# Patient Record
Sex: Female | Born: 2000 | Race: White | Hispanic: No | Marital: Married | Smoking: Never smoker
Health system: Southern US, Community
[De-identification: ages and names within clinical notes are randomized; demographics above are authoritative.]

## PROBLEM LIST (undated history)

## (undated) ENCOUNTER — Inpatient Hospital Stay (HOSPITAL_COMMUNITY): Payer: Self-pay

## (undated) DIAGNOSIS — F419 Anxiety disorder, unspecified: Secondary | ICD-10-CM

## (undated) DIAGNOSIS — G43909 Migraine, unspecified, not intractable, without status migrainosus: Secondary | ICD-10-CM

## (undated) DIAGNOSIS — N926 Irregular menstruation, unspecified: Secondary | ICD-10-CM

## (undated) DIAGNOSIS — Z309 Encounter for contraceptive management, unspecified: Secondary | ICD-10-CM

## (undated) DIAGNOSIS — T1490XA Injury, unspecified, initial encounter: Secondary | ICD-10-CM

## (undated) HISTORY — DX: Anxiety disorder, unspecified: F41.9

## (undated) HISTORY — DX: Irregular menstruation, unspecified: N92.6

## (undated) HISTORY — DX: Injury, unspecified, initial encounter: T14.90XA

## (undated) HISTORY — PX: OTHER SURGICAL HISTORY: SHX169

## (undated) HISTORY — DX: Encounter for contraceptive management, unspecified: Z30.9

## (undated) HISTORY — PX: WISDOM TOOTH EXTRACTION: SHX21

---
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
## 2018-01-19 ENCOUNTER — Encounter: Payer: Self-pay | Admitting: Advanced Practice Midwife

## 2018-01-19 ENCOUNTER — Ambulatory Visit (INDEPENDENT_AMBULATORY_CARE_PROVIDER_SITE_OTHER): Payer: Medicaid Other | Admitting: Advanced Practice Midwife

## 2018-01-19 VITALS — BP 120/80 | HR 109 | Wt 196.0 lb

## 2018-01-19 DIAGNOSIS — Z331 Pregnant state, incidental: Secondary | ICD-10-CM

## 2018-01-19 DIAGNOSIS — Z1389 Encounter for screening for other disorder: Secondary | ICD-10-CM

## 2018-01-19 DIAGNOSIS — Z3A31 31 weeks gestation of pregnancy: Secondary | ICD-10-CM

## 2018-01-19 DIAGNOSIS — Z3403 Encounter for supervision of normal first pregnancy, third trimester: Secondary | ICD-10-CM

## 2018-01-19 LAB — POCT URINALYSIS DIPSTICK
Blood, UA: NEGATIVE
GLUCOSE UA: NEGATIVE
KETONES UA: NEGATIVE
Leukocytes, UA: NEGATIVE
Nitrite, UA: NEGATIVE
Protein, UA: NEGATIVE

## 2018-01-19 NOTE — Progress Notes (Signed)
  [id number] Estimated Date of Delivery: 03/18/18  Blood pressure 120/80, pulse (!) 109, weight 196 lb (88.9 kg), last menstrual period 06/11/2017.   BP weight and urine results all reviewed and noted.  Please refer to the obstetrical flow sheet for the fundal height and fetal heart rate documentation:  Patient reports good fetal movement, denies any bleeding and no rupture of membranes symptoms or regular contractions. Patient is without complaints. All questions were answered.   Physical Assessment:   Vitals:   01/19/18 1531 01/19/18 1553 01/19/18 1558  BP: 120/90 112/82 120/80  Pulse: (!) 109    Weight: 196 lb (88.9 kg)    Body mass index is 31.64 kg/m.  BP rechecks normal        Physical Examination:   General appearance: Well appearing, and in no distress  Mental status: Alert, oriented to person, place, and time  Skin: Warm & dry  Cardiovascular: Normal heart rate noted  Respiratory: Normal respiratory effort, no distress  Abdomen: Soft, gravid, nontender  Pelvic: Cervical exam deferred         Extremities: Edema: Trace  Fetal Status: Fetal Heart Rate (bpm): 152 Fundal Height: 31 cm Movement: Present    Results for orders placed or performed in visit on 01/19/18 (from the past 24 hour(s))  POCT urinalysis dipstick   Collection Time: 01/19/18  3:35 PM  Result Value Ref Range   Color, UA     Clarity, UA     Glucose, UA neg    Bilirubin, UA     Ketones, UA neg    Spec Grav, UA  1.010 - 1.025   Blood, UA neg    pH, UA  5.0 - 8.0   Protein, UA neg    Urobilinogen, UA  0.2 or 1.0 E.U./dL   Nitrite, UA neg    Leukocytes, UA Negative Negative   Appearance     Odor       Orders Placed This Encounter  Procedures  . POCT urinalysis dipstick    Plan:  Continued routine obstetrical care,  Return in about 2 weeks (around 02/02/2018) for LROB.

## 2018-01-19 NOTE — Patient Instructions (Signed)
Madesyn N Thomann, I greatly value your feedback.  If you receive a survey following your visit with us today, we appreciate you taking the time to fill it out.  Thanks, Cathie BeamsFran Cresenzo-Dishmon, CNM   Call the office [phone]) or go to [facility] if:  You begin to have strong, frequent contractions  Your water breaks.  Sometimes it is a big gush of fluid, sometimes it is just a trickle that keeps getting your panties wet or running down your legs  You have vaginal bleeding.  It is normal to have a small amount of spotting if your cervix was checked.   You don't feel your baby moving like normal.  If you don't, get you something to eat and drink and lay down and focus on feeling your baby move.  You should feel at least 10 movements in 2 hours.  If you don't, you should call the office or go to [facility].    Tdap Vaccine  It is recommended that you get the Tdap vaccine during the third trimester of EACH pregnancy to help protect your baby from getting pertussis (whooping cough)  27-36 weeks is the BEST time to do this so that you can pass the protection on to your baby. During pregnancy is better than after pregnancy, but if you are unable to get it during pregnancy it will be offered at the hospital.   You can get this vaccine at the health department or your family doctor  Everyone who will be around your baby should also be up-to-date on their vaccines. Adults (who are not pregnant) only need 1 dose of Tdap during adulthood.   Third Trimester of Pregnancy The third trimester is from week 29 through week 42, months 7 through 9. The third trimester is a time when the fetus is growing rapidly. At the end of the ninth month, the fetus is about 20 inches in length and weighs 6-10 pounds.  BODY CHANGES Your body goes through many changes during pregnancy. The changes vary from woman to woman.   Your weight will continue to increase. You can expect to gain 25-35 pounds (11-16 kg)  by the end of the pregnancy.  You may begin to get stretch marks on your hips, abdomen, and breasts.  You may urinate more often because the fetus is moving lower into your pelvis and pressing on your bladder.  You may develop or continue to have heartburn as a result of your pregnancy.  You may develop constipation because certain hormones are causing the muscles that push waste through your intestines to slow down.  You may develop hemorrhoids or swollen, bulging veins (varicose veins).  You may have pelvic pain because of the weight gain and pregnancy hormones relaxing your joints between the bones in your pelvis. Backaches may result from overexertion of the muscles supporting your posture.  You may have changes in your hair. These can include thickening of your hair, rapid growth, and changes in texture. Some women also have hair loss during or after pregnancy, or hair that feels dry or thin. Your hair will most likely return to normal after your baby is born.  Your breasts will continue to grow and be tender. A yellow discharge may leak from your breasts called colostrum.  Your belly button may stick out.  You may feel short of breath because of your expanding uterus.  You may notice the fetus "dropping," or moving lower in your abdomen.  You may have a bloody mucus  discharge. This usually occurs a few days to a week before labor begins.  Your cervix becomes thin and soft (effaced) near your due date. WHAT TO EXPECT AT YOUR PRENATAL EXAMS  You will have prenatal exams every 2 weeks until week 36. Then, you will have weekly prenatal exams. During a routine prenatal visit:  You will be weighed to make sure you and the fetus are growing normally.  Your blood pressure is taken.  Your abdomen will be measured to track your baby's growth.  The fetal heartbeat will be listened to.  Any test results from the previous visit will be discussed.  You may have a cervical check near  your due date to see if you have effaced. At around 36 weeks, your caregiver will check your cervix. At the same time, your caregiver will also perform a test on the secretions of the vaginal tissue. This test is to determine if a type of bacteria, Group B streptococcus, is present. Your caregiver will explain this further. Your caregiver may ask you:  What your birth plan is.  How you are feeling.  If you are feeling the baby move.  If you have had any abnormal symptoms, such as leaking fluid, bleeding, severe headaches, or abdominal cramping.  If you have any questions. Other tests or screenings that may be performed during your third trimester include:  Blood tests that check for low iron levels (anemia).  Fetal testing to check the health, activity level, and growth of the fetus. Testing is done if you have certain medical conditions or if there are problems during the pregnancy. FALSE LABOR You may feel small, irregular contractions that eventually go away. These are called Braxton Hicks contractions, or false labor. Contractions may last for hours, days, or even weeks before true labor sets in. If contractions come at regular intervals, intensify, or become painful, it is best to be seen by your caregiver.  SIGNS OF LABOR   Menstrual-like cramps.  Contractions that are 5 minutes apart or less.  Contractions that start on the top of the uterus and spread down to the lower abdomen and back.  A sense of increased pelvic pressure or back pain.  A watery or bloody mucus discharge that comes from the vagina. If you have any of these signs before the 37th week of pregnancy, call your caregiver right away. You need to go to the hospital to get checked immediately. HOME CARE INSTRUCTIONS   Avoid all smoking, herbs, alcohol, and unprescribed drugs. These chemicals affect the formation and growth of the baby.  Follow your caregiver's instructions regarding medicine use. There are  medicines that are either safe or unsafe to take during pregnancy.  Exercise only as directed by your caregiver. Experiencing uterine cramps is a good sign to stop exercising.  Continue to eat regular, healthy meals.  Wear a good support bra for breast tenderness.  Do not use hot tubs, steam rooms, or saunas.  Wear your seat belt at all times when driving.  Avoid raw meat, uncooked cheese, cat litter boxes, and soil used by cats. These carry germs that can cause birth defects in the baby.  Take your prenatal vitamins.  Try taking a stool softener (if your caregiver approves) if you develop constipation. Eat more high-fiber foods, such as fresh vegetables or fruit and whole grains. Drink plenty of fluids to keep your urine clear or pale yellow.  Take warm sitz baths to soothe any pain or discomfort caused by hemorrhoids. Use  hemorrhoid cream if your caregiver approves.  If you develop varicose veins, wear support hose. Elevate your feet for 15 minutes, 3-4 times a day. Limit salt in your diet.  Avoid heavy lifting, wear low heal shoes, and practice good posture.  Rest a lot with your legs elevated if you have leg cramps or low back pain.  Visit your dentist if you have not gone during your pregnancy. Use a soft toothbrush to brush your teeth and be gentle when you floss.  A sexual relationship may be continued unless your caregiver directs you otherwise.  Do not travel far distances unless it is absolutely necessary and only with the approval of your caregiver.  Take prenatal classes to understand, practice, and ask questions about the labor and delivery.  Make a trial run to the hospital.  Pack your hospital bag.  Prepare the baby's nursery.  Continue to go to all your prenatal visits as directed by your caregiver. SEEK MEDICAL CARE IF:  You are unsure if you are in labor or if your water has broken.  You have dizziness.  You have mild pelvic cramps, pelvic pressure, or  nagging pain in your abdominal area.  You have persistent nausea, vomiting, or diarrhea.  You have a bad smelling vaginal discharge.  You have pain with urination. SEEK IMMEDIATE MEDICAL CARE IF:   You have a fever.  You are leaking fluid from your vagina.  You have spotting or bleeding from your vagina.  You have severe abdominal cramping or pain.  You have rapid weight loss or gain.  You have shortness of breath with chest pain.  You notice sudden or extreme swelling of your face, hands, ankles, feet, or legs.  You have not felt your baby move in over an hour.  You have severe headaches that do not go away with medicine.  You have vision changes. Document Released: 09/15/2001 Document Revised: 09/26/2013 Document Reviewed: 11/22/2012 [facility] Patient Information 2015 [place], [place]. This information is not intended to replace advice given to you by your health care provider. Make sure you discuss any questions you have with your health care provider.

## 2018-02-02 ENCOUNTER — Encounter: Payer: Self-pay | Admitting: Advanced Practice Midwife

## 2018-02-02 ENCOUNTER — Ambulatory Visit (INDEPENDENT_AMBULATORY_CARE_PROVIDER_SITE_OTHER): Payer: Medicaid Other | Admitting: Advanced Practice Midwife

## 2018-02-02 VITALS — BP 120/80 | HR 98 | Wt 203.0 lb

## 2018-02-02 DIAGNOSIS — Z1389 Encounter for screening for other disorder: Secondary | ICD-10-CM

## 2018-02-02 DIAGNOSIS — Z3403 Encounter for supervision of normal first pregnancy, third trimester: Secondary | ICD-10-CM

## 2018-02-02 DIAGNOSIS — Z331 Pregnant state, incidental: Secondary | ICD-10-CM

## 2018-02-02 DIAGNOSIS — Z3A33 33 weeks gestation of pregnancy: Secondary | ICD-10-CM

## 2018-02-02 LAB — POCT URINALYSIS DIPSTICK
Blood, UA: NEGATIVE
Glucose, UA: NEGATIVE
KETONES UA: NEGATIVE
LEUKOCYTES UA: NEGATIVE
NITRITE UA: NEGATIVE
Protein, UA: NEGATIVE

## 2018-02-02 NOTE — Progress Notes (Signed)
  G1P0000 [redacted]w[redacted]d Estimated Date of Delivery: 03/18/18  Blood pressure 120/80, pulse 98, weight 203 lb (92.1 kg), last menstrual period 06/11/2017.   BP weight and urine results all reviewed and noted.  Please refer to the obstetrical flow sheet for the fundal height and fetal heart rate documentation:  Patient reports good fetal movement, denies any bleeding and no rupture of membranes symptoms or regular contractions. Patient is without complaints. All questions were answered.   Physical Assessment:   Vitals:   02/02/18 1536  BP: 120/80  Pulse: 98  Weight: 203 lb (92.1 kg)  Body mass index is 32.77 kg/m.        Physical Examination:   General appearance: Well appearing, and in no distress  Mental status: Alert, oriented to person, place, and time  Skin: Warm & dry  Cardiovascular: Normal heart rate noted  Respiratory: Normal respiratory effort, no distress  Abdomen: Soft, gravid, nontender  Pelvic: Cervical exam deferred         Extremities: Edema: None  Fetal Status: Fetal Heart Rate (bpm): 147 Fundal Height: 33 cm Movement: Present    Results for orders placed or performed in visit on 02/02/18 (from the past 24 hour(s))  POCT urinalysis dipstick   Collection Time: 02/02/18  3:40 PM  Result Value Ref Range   Color, UA     Clarity, UA     Glucose, UA neg    Bilirubin, UA     Ketones, UA neg    Spec Grav, UA  1.010 - 1.025   Blood, UA neg    pH, UA  5.0 - 8.0   Protein, UA neg    Urobilinogen, UA  0.2 or 1.0 E.U./dL   Nitrite, UA neg    Leukocytes, UA Negative Negative   Appearance     Odor       Orders Placed This Encounter  Procedures  . POCT urinalysis dipstick    Plan:  Continued routine obstetrical care,   Return in about 2 weeks (around 02/16/2018) for LROB.

## 2018-02-12 ENCOUNTER — Inpatient Hospital Stay (HOSPITAL_COMMUNITY)
Admission: AD | Admit: 2018-02-12 | Discharge: 2018-02-12 | Disposition: A | Discharge status: Home / Self Care | Payer: Medicaid Other | Source: Ambulatory Visit | Attending: Family Medicine | Admitting: Family Medicine

## 2018-02-12 ENCOUNTER — Other Ambulatory Visit: Payer: Self-pay

## 2018-02-12 DIAGNOSIS — R0781 Pleurodynia: Secondary | ICD-10-CM | POA: Diagnosis present

## 2018-02-12 DIAGNOSIS — O9989 Other specified diseases and conditions complicating pregnancy, childbirth and the puerperium: Secondary | ICD-10-CM | POA: Diagnosis not present

## 2018-02-12 DIAGNOSIS — Z3A35 35 weeks gestation of pregnancy: Secondary | ICD-10-CM | POA: Diagnosis not present

## 2018-02-12 DIAGNOSIS — Z825 Family history of asthma and other chronic lower respiratory diseases: Secondary | ICD-10-CM | POA: Diagnosis not present

## 2018-02-12 DIAGNOSIS — M940 Chondrocostal junction syndrome [Tietze]: Secondary | ICD-10-CM

## 2018-02-12 LAB — URINALYSIS, ROUTINE W REFLEX MICROSCOPIC
Bilirubin Urine: NEGATIVE
Glucose, UA: NEGATIVE mg/dL
Hgb urine dipstick: NEGATIVE
Ketones, ur: NEGATIVE mg/dL
LEUKOCYTES UA: NEGATIVE
Nitrite: NEGATIVE
PH: 7 (ref 5.0–8.0)
Protein, ur: NEGATIVE mg/dL
SPECIFIC GRAVITY, URINE: 1.004 — AB (ref 1.005–1.030)

## 2018-02-12 MED ORDER — CYCLOBENZAPRINE HCL 5 MG PO TABS
5.0000 mg | ORAL_TABLET | Freq: Once | ORAL | Status: AC
  Administered 2018-02-12: 5 mg via ORAL
  Filled 2018-02-12: qty 1
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
## 2018-02-12 NOTE — MAU Note (Signed)
Patient c/o  Rib pain --Left upper side Started around 3pm Constant Rating pain 6/10 Has not taken any medication  Denies vaginal bleeding or discharge.  +FM

## 2018-02-12 NOTE — Discharge Instructions (Signed)
Costochondritis Costochondritis is swelling and irritation (inflammation) of the tissue (cartilage) that connects your ribs to your breastbone (sternum). This causes pain in the front of your chest. The pain usually starts gradually and involves more than one rib. What are the causes? The exact cause of this condition is not always known. It results from stress on the cartilage where your ribs attach to your sternum. The cause of this stress could be:  Chest injury (trauma).  Exercise or activity, such as lifting.  Severe coughing.  What increases the risk? You may be at higher risk for this condition if you:  Are female.  Are 30?19 years old.  Recently started a new exercise or work activity.  Have low levels of vitamin D.  Have a condition that makes you cough frequently.  What are the signs or symptoms? The main symptom of this condition is chest pain. The pain:  Usually starts gradually and can be sharp or dull.  Gets worse with deep breathing, coughing, or exercise.  Gets better with rest.  May be worse when you press on the sternum-rib connection (tenderness).  How is this diagnosed? This condition is diagnosed based on your symptoms, medical history, and a physical exam. Your health care provider will check for tenderness when pressing on your sternum. This is the most important finding. You may also have tests to rule out other causes of chest pain. These may include:  A chest X-ray to check for lung problems.  An electrocardiogram (ECG) to see if you have a heart problem that could be causing the pain.  An imaging scan to rule out a chest or rib fracture.  How is this treated? This condition usually goes away on its own over time. Your health care provider may prescribe an NSAID to reduce pain and inflammation. Your health care provider may also suggest that you:  Rest and avoid activities that make pain worse.  Apply heat or cold to the area to reduce pain  and inflammation.  Do exercises to stretch your chest muscles.  If these treatments do not help, your health care provider may inject a numbing medicine at the sternum-rib connection to help relieve the pain. Follow these instructions at home:  Avoid activities that make pain worse. This includes any activities that use chest, abdominal, and side muscles.  If directed, put ice on the painful area: ? Put ice in a plastic bag. ? Place a towel between your skin and the bag. ? Leave the ice on for 20 minutes, 2-3 times a day.  If directed, apply heat to the affected area as often as told by your health care provider. Use the heat source that your health care provider recommends, such as a moist heat pack or a heating pad. ? Place a towel between your skin and the heat source. ? Leave the heat on for 20-30 minutes. ? Remove the heat if your skin turns bright red. This is especially important if you are unable to feel pain, heat, or cold. You may have a greater risk of getting burned.  Take over-the-counter and prescription medicines only as told by your health care provider.  Return to your normal activities as told by your health care provider. Ask your health care provider what activities are safe for you.  Keep all follow-up visits as told by your health care provider. This is important. Contact a health care provider if:  You have chills or a fever.  Your pain does not go   away or it gets worse.  You have a cough that does not go away (is persistent). Get help right away if:  You have shortness of breath. This information is not intended to replace advice given to you by your health care provider. Make sure you discuss any questions you have with your health care provider. Document Released: 07/01/2005 Document Revised: 04/10/2016 Document Reviewed: 01/15/2016 Elsevier Interactive Patient Education  2018 [employer] Inc[employer]   

## 2018-02-12 NOTE — MAU Note (Signed)
Pt received to MAU c/o upper left side rib pain which started around 3pm today. Patient rates pain 6/10 on pain scale. Pt states she had taken no medication to try and relieve the pain. Pt denies vaginal bleeding or ROM. Positive fetal movement. Pt placed on EFM. FHR 145. Carmelina Dane, RN

## 2018-02-15 ENCOUNTER — Encounter: Payer: Medicaid Other | Admitting: Women's Health

## 2018-02-16 ENCOUNTER — Encounter: Payer: Medicaid Other | Admitting: Women's Health

## 2018-02-18 ENCOUNTER — Encounter: Payer: Medicaid Other | Admitting: Obstetrics and Gynecology

## 2018-02-24 ENCOUNTER — Encounter: Payer: Self-pay | Admitting: Women's Health

## 2018-02-24 ENCOUNTER — Ambulatory Visit (INDEPENDENT_AMBULATORY_CARE_PROVIDER_SITE_OTHER): Payer: Medicaid Other | Admitting: Women's Health

## 2018-02-24 VITALS — BP 126/78 | HR 92 | Wt 209.0 lb

## 2018-02-24 DIAGNOSIS — Z23 Encounter for immunization: Secondary | ICD-10-CM | POA: Diagnosis not present

## 2018-02-24 DIAGNOSIS — Z1389 Encounter for screening for other disorder: Secondary | ICD-10-CM

## 2018-02-24 DIAGNOSIS — Z3403 Encounter for supervision of normal first pregnancy, third trimester: Secondary | ICD-10-CM

## 2018-02-24 DIAGNOSIS — Z331 Pregnant state, incidental: Secondary | ICD-10-CM

## 2018-02-24 DIAGNOSIS — Z3A36 36 weeks gestation of pregnancy: Secondary | ICD-10-CM

## 2018-02-24 LAB — POCT URINALYSIS DIPSTICK
Blood, UA: NEGATIVE
Glucose, UA: NEGATIVE
Ketones, UA: NEGATIVE
Leukocytes, UA: NEGATIVE
Nitrite, UA: NEGATIVE
Protein, UA: POSITIVE — AB

## 2018-02-24 LAB — OB RESULTS CONSOLE GBS: STREP GROUP B AG: NEGATIVE

## 2018-02-24 NOTE — Progress Notes (Signed)
   LOW-RISK PREGNANCY VISIT Patient name: Brittany Kim MRN [id number]  Date of birth: March 13, 2001 Chief Complaint:   Routine Prenatal Visit  History of Present Illness:   Brittany Kim is a 17 y.o. G4P0000 female at [redacted]w[redacted]d with an Estimated Date of Delivery: 03/18/18 being seen today for ongoing management of a low-risk pregnancy.  Today she reports occ bilateral hand numbness. Contractions: Not present. Vag. Bleeding: None.  Movement: Present. denies leaking of fluid. Review of Systems:   Pertinent items are noted in HPI Denies abnormal vaginal discharge w/ itching/odor/irritation, headaches, visual changes, shortness of breath, chest pain, abdominal pain, severe nausea/vomiting, or problems with urination or bowel movements unless otherwise stated above. Pertinent History Reviewed:  Reviewed past medical,surgical, social, obstetrical and family history.  Reviewed problem list, medications and allergies. Physical Assessment:   Vitals:   02/24/18 1010  BP: 126/78  Pulse: 92  Weight: 209 lb (94.8 kg)  Body mass index is 33.73 kg/m.        Physical Examination:   General appearance: Well appearing, and in no distress  Mental status: Alert, oriented to person, place, and time  Skin: Warm & dry  Cardiovascular: Normal heart rate noted  Respiratory: Normal respiratory effort, no distress  Abdomen: Soft, gravid, nontender  Pelvic: Cervical exam deferred, pt declined         Extremities: Edema: None  Fetal Status: Fetal Heart Rate (bpm): 145 Fundal Height: 35 cm Movement: Present Presentation: Vertex by Thayer Ohm  Results for orders placed or performed in visit on 02/24/18 (from the past 24 hour(s))  POCT Urinalysis Dipstick   Collection Time: 02/24/18 10:11 AM  Result Value Ref Range   Color, UA     Clarity, UA     Glucose, UA Negative Negative   Bilirubin, UA     Ketones, UA neg    Spec Grav, UA  1.010 - 1.025   Blood, UA neg    pH, UA  5.0 - 8.0   Protein, UA Positive  (A) Negative   Urobilinogen, UA  0.2 or 1.0 E.U./dL   Nitrite, UA neg    Leukocytes, UA Negative Negative   Appearance     Odor      Assessment & Plan:  1) Low-risk pregnancy G1P0000 at [redacted]w[redacted]d with an Estimated Date of Delivery: 03/18/18    Meds: No orders of the defined types were placed in this encounter.  Labs/procedures today: gbs, gc/ct, tdap  Plan:  Continue routine obstetrical care   Reviewed: Preterm labor symptoms and general obstetric precautions including but not limited to vaginal bleeding, contractions, leaking of fluid and fetal movement were reviewed in detail with the patient.  All questions were answered  Follow-up: Return in about 1 week (around 03/03/2018) for LROB.  Orders Placed This Encounter  Procedures  . GC/Chlamydia Probe Amp([facility])  . Culture, beta strep (group b only)  . Tdap vaccine greater than or equal to 7yo IM  . POCT Urinalysis Dipstick   Cheral Marker CNM, [facility] 02/24/2018 11:13 AM

## 2018-02-24 NOTE — Patient Instructions (Signed)
Brittany Kim, I greatly value your feedback.  If you receive a survey following your visit with [name] today, we appreciate you taking the time to fill it out.  Thanks, [name], CNM, WHNP-BC   Call the office [phone]) or go to [facility] if:  You begin to have strong, frequent contractions  Your water breaks.  Sometimes it is a big gush of fluid, sometimes it is just a trickle that keeps getting your panties wet or running down your legs  You have vaginal bleeding.  It is normal to have a small amount of spotting if your cervix was checked.   You don't feel your baby moving like normal.  If you don't, get you something to eat and drink and lay down and focus on feeling your baby move.  You should feel at least 10 movements in 2 hours.  If you don't, you should call the office or go to [facility].     [facility] Contractions Contractions of the uterus can occur throughout pregnancy, but they are not always a sign that you are in labor. You may have practice contractions called Braxton Hicks contractions. These false labor contractions are sometimes confused with true labor. What are Deberah Pelton contractions? Braxton Hicks contractions are tightening movements that occur in the muscles of the uterus before labor. Unlike true labor contractions, these contractions do not result in opening (dilation) and thinning of the cervix. Toward the end of pregnancy (32-34 weeks), Braxton Hicks contractions can happen more often and may become stronger. These contractions are sometimes difficult to tell apart from true labor because they can be very uncomfortable. You should not feel embarrassed if you go to the hospital with false labor. Sometimes, the only way to tell if you are in true labor is for your health care provider to look for changes in the cervix. The health care provider will do a physical exam and may monitor your contractions. If you are not in true labor, the exam should  show that your cervix is not dilating and your water has not broken. If there are other health problems associated with your pregnancy, it is completely safe for you to be sent home with false labor. You may continue to have Braxton Hicks contractions until you go into true labor. How to tell the difference between true labor and false labor True labor  Contractions last 30-70 seconds.  Contractions become very regular.  Discomfort is usually felt in the top of the uterus, and it spreads to the lower abdomen and low back.  Contractions do not go away with walking.  Contractions usually become more intense and increase in frequency.  The cervix dilates and gets thinner. False labor  Contractions are usually shorter and not as strong as true labor contractions.  Contractions are usually irregular.  Contractions are often felt in the front of the lower abdomen and in the groin.  Contractions may go away when you walk around or change positions while lying down.  Contractions get weaker and are shorter-lasting as time goes on.  The cervix usually does not dilate or become thin. Follow these instructions at home:  Take over-the-counter and prescription medicines only as told by your health care provider.  Keep up with your usual exercises and follow other instructions from your health care provider.  Eat and drink lightly if you think you are going into labor.  If Braxton Hicks contractions are making you uncomfortable: ? Change your position from lying  down or resting to walking, or change from walking to resting. ? Sit and rest in a tub of warm water. ? Drink enough fluid to keep your urine pale yellow. Dehydration may cause these contractions. ? Do slow and deep breathing several times an hour.  Keep all follow-up prenatal visits as told by your health care provider. This is important. Contact a health care provider if:  You have a fever.  You have continuous pain in  your abdomen. Get help right away if:  Your contractions become stronger, more regular, and closer together.  You have fluid leaking or gushing from your vagina.  You pass blood-tinged mucus (bloody show).  You have bleeding from your vagina.  You have low back pain that you never had before.  You feel your baby's head pushing down and causing pelvic pressure.  Your baby is not moving inside you as much as it used to. Summary  Contractions that occur before labor are called Braxton Hicks contractions, false labor, or practice contractions.  Braxton Hicks contractions are usually shorter, weaker, farther apart, and less regular than true labor contractions. True labor contractions usually become progressively stronger and regular and they become more frequent.  Manage discomfort from [facility] contractions by changing position, resting in a warm bath, drinking plenty of water, or practicing deep breathing. This information is not intended to replace advice given to you by your health care provider. Make sure you discuss any questions you have with your health care provider. Document Released: 02/04/2017 Document Revised: 02/04/2017 Document Reviewed: 02/04/2017 Elsevier Interactive Patient Education  2018 [employer].

## 2018-02-28 LAB — CULTURE, BETA STREP (GROUP B ONLY): STREP GP B CULTURE: NEGATIVE

## 2018-03-01 LAB — GC/CHLAMYDIA PROBE AMP
Chlamydia trachomatis, NAA: NEGATIVE
Neisseria gonorrhoeae by PCR: NEGATIVE

## 2018-03-03 ENCOUNTER — Ambulatory Visit (INDEPENDENT_AMBULATORY_CARE_PROVIDER_SITE_OTHER): Payer: Medicaid Other | Admitting: Obstetrics and Gynecology

## 2018-03-03 ENCOUNTER — Encounter: Payer: Self-pay | Admitting: Obstetrics and Gynecology

## 2018-03-03 VITALS — BP 110/80 | HR 98 | Wt 211.0 lb

## 2018-03-03 DIAGNOSIS — Z1389 Encounter for screening for other disorder: Secondary | ICD-10-CM

## 2018-03-03 DIAGNOSIS — Z331 Pregnant state, incidental: Secondary | ICD-10-CM

## 2018-03-03 DIAGNOSIS — Z3403 Encounter for supervision of normal first pregnancy, third trimester: Secondary | ICD-10-CM

## 2018-03-03 DIAGNOSIS — Z3A37 37 weeks gestation of pregnancy: Secondary | ICD-10-CM

## 2018-03-03 LAB — POCT URINALYSIS DIPSTICK
GLUCOSE UA: NEGATIVE
Ketones, UA: NEGATIVE
LEUKOCYTES UA: NEGATIVE
Nitrite, UA: NEGATIVE
Protein, UA: POSITIVE — AB
RBC UA: NEGATIVE

## 2018-03-03 NOTE — Progress Notes (Signed)
Patient ID: Brittany Kim, female   DOB: June 20, 2001, 17 y.o.   MRN: [id number]   LOW-RISK PREGNANCY VISIT Patient name: Brittany Kim MRN [id number]  Date of birth: [birth year] Chief Complaint:   low risk ob  History of Present Illness:   Brittany Kim is a 17 y.o. G54P0000 female at [redacted]w[redacted]d with an Estimated Date of Delivery: 03/18/18 being seen today for ongoing management of a low-risk pregnancy.  Today she reports ankle swelling and has been drinking more water. She has not done any child birthing classes. She has a nurse that comes to her house that has showed her various videos. She is interested in IV pain medication verses an epidural. Contractions: Not present.  .  Movement: Present. denies leaking of fluid. Review of Systems:   Pertinent items are noted in HPI Denies abnormal vaginal discharge w/ itching/odor/irritation, headaches, visual changes, shortness of breath, chest pain, abdominal pain, severe nausea/vomiting, or problems with urination or bowel movements unless otherwise stated above. Pertinent History Reviewed:  Reviewed past medical,surgical, social, obstetrical and family history.  Reviewed problem list, medications and allergies. Physical Assessment:   Vitals:   03/03/18 1405  BP: 110/80  Pulse: 98  Weight: 211 lb (95.7 kg)  Body mass index is 34.06 kg/m.        Physical Examination:   General appearance: Well appearing, and in no distress  Mental status: Alert, oriented to person, place, and time  Skin: Warm & dry  Cardiovascular: Normal heart rate noted  Respiratory: Normal respiratory effort, no distress  Abdomen: Soft, gravid, nontender  Pelvic: Cervical exam deferred         Extremities: Edema: Trace  Fetal Status: Fetal Heart Rate (bpm): 140 Fundal Height: 37 cm Movement: Present    Results for orders placed or performed in visit on 03/03/18 (from the past 24 hour(s))  POCT urinalysis dipstick   Collection Time: 03/03/18  2:11 PM  Result Value  Ref Range   Color, UA     Clarity, UA     Glucose, UA Negative Negative   Bilirubin, UA     Ketones, UA neg    Spec Grav, UA  1.010 - 1.025   Blood, UA neg    pH, UA  5.0 - 8.0   Protein, UA Positive (A) Negative   Urobilinogen, UA  0.2 or 1.0 E.U./dL   Nitrite, UA neg    Leukocytes, UA Negative Negative   Appearance     Odor      Assessment & Plan:  1) Low-risk pregnancy G1P0000 at [redacted]w[redacted]d with an Estimated Date of Delivery: 03/18/18    Meds: No orders of the defined types were placed in this encounter.  Labs/procedures today:   Plan:  Continue routine obstetrical care   Reviewed: Term labor symptoms and general obstetric precautions including but not limited to vaginal bleeding, contractions, leaking of fluid and fetal movement were reviewed in detail with the patient.  All questions were answered  Follow-up: Return in about 1 week (around 03/10/2018) for LROB.  Orders Placed This Encounter  Procedures  . POCT urinalysis dipstick   By signing my name below, I, Diona Browner, attest that this documentation has been prepared under the direction and in the presence of Tilda Burrow, MD. Electronically Signed: Diona Browner, Medical Scribe. 03/03/18. 2:29 PM.  I personally performed the services described in this documentation, which was SCRIBED in my presence. The recorded information has been reviewed and considered accurate. It has been edited  as necessary during review. Jonnie Kind, MD

## 2018-03-03 NOTE — Patient Instructions (Signed)
(  [phone] is the phone number for Pregnancy Classes or hospital tours at [facility].  ° °You will be referred to  [email or web address]   for more information on childbirth classes   °At this site you may register for classes. You may sign up for a waiting list if classes are full. Please SIGN UP FOR THIS!.   When the waiting list becomes long, sometimes new classes can be added. ° ° ° °

## 2018-03-10 ENCOUNTER — Encounter: Payer: Self-pay | Admitting: Advanced Practice Midwife

## 2018-03-10 ENCOUNTER — Other Ambulatory Visit: Payer: Self-pay

## 2018-03-10 ENCOUNTER — Ambulatory Visit (INDEPENDENT_AMBULATORY_CARE_PROVIDER_SITE_OTHER): Payer: Medicaid Other | Admitting: Advanced Practice Midwife

## 2018-03-10 VITALS — BP 122/74 | HR 117 | Wt 210.0 lb

## 2018-03-10 DIAGNOSIS — Z3A38 38 weeks gestation of pregnancy: Secondary | ICD-10-CM | POA: Diagnosis not present

## 2018-03-10 DIAGNOSIS — Z331 Pregnant state, incidental: Secondary | ICD-10-CM

## 2018-03-10 DIAGNOSIS — Z1389 Encounter for screening for other disorder: Secondary | ICD-10-CM

## 2018-03-10 DIAGNOSIS — Z3403 Encounter for supervision of normal first pregnancy, third trimester: Secondary | ICD-10-CM

## 2018-03-10 LAB — POCT URINALYSIS DIPSTICK
Blood, UA: NEGATIVE
Glucose, UA: NEGATIVE
Ketones, UA: NEGATIVE
LEUKOCYTES UA: NEGATIVE
NITRITE UA: NEGATIVE
PROTEIN UA: POSITIVE — AB

## 2018-03-10 NOTE — Patient Instructions (Signed)

## 2018-03-10 NOTE — Progress Notes (Signed)
  G1P0000 9649w6d Estimated Date of Delivery: 03/18/18  Blood pressure 122/74, pulse (!) 117, weight 210 lb (95.3 kg), last menstrual period 06/11/2017.   BP weight and urine results all reviewed and noted.  Please refer to the obstetrical flow sheet for the fundal height and fetal heart rate documentation:  Patient reports good fetal movement, denies any bleeding and no rupture of membranes symptoms or regular contractions. Patient is without complaints. All questions were answered.   Physical Assessment:   Vitals:   03/10/18 1212  BP: 122/74  Pulse: (!) 117  Weight: 210 lb (95.3 kg)  Body mass index is 33.89 kg/m.        Physical Examination:   General appearance: Well appearing, and in no distress  Mental status: Alert, oriented to person, place, and time  Skin: Warm & dry  Cardiovascular: Normal heart rate noted  Respiratory: Normal respiratory effort, no distress  Abdomen: Soft, gravid, nontender  Pelvic: 1/thick/-3         Extremities: Edema: Trace  Fetal Status:     Movement: Present    Results for orders placed or performed in visit on 03/10/18 (from the past 24 hour(s))  POCT urinalysis dipstick   Collection Time: 03/10/18 12:15 PM  Result Value Ref Range   Color, UA     Clarity, UA     Glucose, UA Negative Negative   Bilirubin, UA     Ketones, UA neg    Spec Grav, UA  1.010 - 1.025   Blood, UA neg    pH, UA  5.0 - 8.0   Protein, UA Positive (A) Negative   Urobilinogen, UA  0.2 or 1.0 E.U./dL   Nitrite, UA neg    Leukocytes, UA Negative Negative   Appearance     Odor       Orders Placed This Encounter  Procedures  . POCT urinalysis dipstick    Plan:  Continued routine obstetrical care, plan to schedule IOL next week for 41 weeks if undeliverd  Return in about 1 week (around 03/17/2018) for LROB.

## 2018-03-16 ENCOUNTER — Other Ambulatory Visit: Payer: Self-pay

## 2018-03-16 ENCOUNTER — Encounter (HOSPITAL_COMMUNITY): Payer: Self-pay | Admitting: *Deleted

## 2018-03-16 ENCOUNTER — Inpatient Hospital Stay (HOSPITAL_COMMUNITY)
Admission: AD | Admit: 2018-03-16 | Discharge: 2018-03-16 | Disposition: A | Discharge status: Home / Self Care | Payer: Medicaid Other | Source: Ambulatory Visit | Attending: Obstetrics & Gynecology | Admitting: Obstetrics & Gynecology

## 2018-03-16 ENCOUNTER — Inpatient Hospital Stay (HOSPITAL_COMMUNITY): Payer: Medicaid Other | Admitting: Anesthesiology

## 2018-03-16 ENCOUNTER — Inpatient Hospital Stay (HOSPITAL_COMMUNITY)
Admission: AD | Admit: 2018-03-16 | Discharge: 2018-03-16 | Disposition: A | Discharge status: Home / Self Care | Payer: Medicaid Other | Source: Ambulatory Visit | Attending: Obstetrics and Gynecology | Admitting: Obstetrics and Gynecology

## 2018-03-16 ENCOUNTER — Inpatient Hospital Stay (HOSPITAL_COMMUNITY)
Admission: AD | Admit: 2018-03-16 | Discharge: 2018-03-19 | DRG: 807 | Disposition: A | Discharge status: Home / Self Care | Payer: Medicaid Other | Attending: Obstetrics and Gynecology | Admitting: Obstetrics and Gynecology

## 2018-03-16 ENCOUNTER — Telehealth: Payer: Self-pay | Admitting: Women's Health

## 2018-03-16 DIAGNOSIS — O479 False labor, unspecified: Secondary | ICD-10-CM

## 2018-03-16 DIAGNOSIS — Z7722 Contact with and (suspected) exposure to environmental tobacco smoke (acute) (chronic): Secondary | ICD-10-CM | POA: Diagnosis present

## 2018-03-16 DIAGNOSIS — Z3A39 39 weeks gestation of pregnancy: Secondary | ICD-10-CM | POA: Diagnosis not present

## 2018-03-16 DIAGNOSIS — Z3483 Encounter for supervision of other normal pregnancy, third trimester: Secondary | ICD-10-CM | POA: Diagnosis present

## 2018-03-16 DIAGNOSIS — Z3400 Encounter for supervision of normal first pregnancy, unspecified trimester: Secondary | ICD-10-CM

## 2018-03-16 DIAGNOSIS — Z3403 Encounter for supervision of normal first pregnancy, third trimester: Secondary | ICD-10-CM

## 2018-03-16 LAB — URINALYSIS, ROUTINE W REFLEX MICROSCOPIC
Bilirubin Urine: NEGATIVE
GLUCOSE, UA: NEGATIVE mg/dL
Hgb urine dipstick: NEGATIVE
KETONES UR: NEGATIVE mg/dL
LEUKOCYTES UA: NEGATIVE
NITRITE: NEGATIVE
Protein, ur: NEGATIVE mg/dL
Specific Gravity, Urine: 1.01 (ref 1.005–1.030)
pH: 7 (ref 5.0–8.0)

## 2018-03-16 LAB — CBC
HCT: 37.6 % (ref 36.0–46.0)
HEMOGLOBIN: 13.4 g/dL (ref 12.0–15.0)
MCH: 32.3 pg (ref 26.0–34.0)
MCHC: 35.6 g/dL (ref 30.0–36.0)
MCV: 90.6 fL (ref 78.0–100.0)
PLATELETS: 175 10*3/uL (ref 150–400)
RBC: 4.15 MIL/uL (ref 3.87–5.11)
RDW: 14.2 % (ref 11.5–15.5)
WBC: 19.6 10*3/uL — ABNORMAL HIGH (ref 4.0–10.5)

## 2018-03-16 LAB — TYPE AND SCREEN
ABO/RH(D): AB POS
ANTIBODY SCREEN: NEGATIVE

## 2018-03-16 MED ORDER — DIPHENHYDRAMINE HCL 50 MG/ML IJ SOLN: 12.5000 mg | INTRAMUSCULAR | Status: DC | PRN

## 2018-03-16 MED ORDER — EPHEDRINE 5 MG/ML INJ: 10.0000 mg | INTRAVENOUS | Status: DC | PRN

## 2018-03-16 MED ORDER — LACTATED RINGERS IV SOLN: 500.0000 mL | Freq: Once | INTRAVENOUS | Status: DC

## 2018-03-16 MED ORDER — OXYTOCIN 40 UNITS IN LACTATED RINGERS INFUSION - SIMPLE MED
2.5000 [IU]/h | INTRAVENOUS | Status: DC
  Administered 2018-03-17: 2.5 [IU]/h via INTRAVENOUS
  Filled 2018-03-16: qty 1000

## 2018-03-16 MED ORDER — FENTANYL CITRATE (PF) 100 MCG/2ML IJ SOLN: 100.0000 ug | INTRAMUSCULAR | Status: DC | PRN

## 2018-03-16 MED ORDER — ACETAMINOPHEN 325 MG PO TABS: 650.0000 mg | ORAL_TABLET | ORAL | Status: DC | PRN

## 2018-03-16 MED ORDER — FENTANYL 2.5 MCG/ML BUPIVACAINE 1/10 % EPIDURAL INFUSION (WH - ANES)
14.0000 mL/h | INTRAMUSCULAR | Status: DC | PRN
  Administered 2018-03-16 – 2018-03-17 (×2): 14 mL/h via EPIDURAL
  Filled 2018-03-16: qty 100

## 2018-03-16 MED ORDER — SOD CITRATE-CITRIC ACID 500-334 MG/5ML PO SOLN: 30.0000 mL | ORAL | Status: DC | PRN

## 2018-03-16 MED ORDER — FLEET ENEMA 7-19 GM/118ML RE ENEM: 1.0000 | ENEMA | RECTAL | Status: DC | PRN

## 2018-03-16 MED ORDER — OXYCODONE-ACETAMINOPHEN 5-325 MG PO TABS: 1.0000 | ORAL_TABLET | ORAL | Status: DC | PRN

## 2018-03-16 MED ORDER — PHENYLEPHRINE 40 MCG/ML (10ML) SYRINGE FOR IV PUSH (FOR BLOOD PRESSURE SUPPORT): 80.0000 ug | PREFILLED_SYRINGE | INTRAVENOUS | Status: DC | PRN

## 2018-03-16 MED ORDER — OXYCODONE-ACETAMINOPHEN 5-325 MG PO TABS: 2.0000 | ORAL_TABLET | ORAL | Status: DC | PRN

## 2018-03-16 MED ORDER — LACTATED RINGERS IV SOLN: 500.0000 mL | INTRAVENOUS | Status: DC | PRN

## 2018-03-16 MED ORDER — PHENYLEPHRINE 40 MCG/ML (10ML) SYRINGE FOR IV PUSH (FOR BLOOD PRESSURE SUPPORT)
PREFILLED_SYRINGE | INTRAVENOUS | Status: AC
  Filled 2018-03-16: qty 20

## 2018-03-16 MED ORDER — LIDOCAINE HCL (PF) 1 % IJ SOLN
30.0000 mL | INTRAMUSCULAR | Status: AC | PRN
  Administered 2018-03-17: 30 mL via SUBCUTANEOUS
  Filled 2018-03-16: qty 30

## 2018-03-16 MED ORDER — FENTANYL 2.5 MCG/ML BUPIVACAINE 1/10 % EPIDURAL INFUSION (WH - ANES)
INTRAMUSCULAR | Status: AC
  Filled 2018-03-16: qty 100

## 2018-03-16 MED ORDER — ONDANSETRON HCL 4 MG/2ML IJ SOLN: 4.0000 mg | Freq: Four times a day (QID) | INTRAMUSCULAR | Status: DC | PRN

## 2018-03-16 MED ORDER — OXYTOCIN BOLUS FROM INFUSION
500.0000 mL | Freq: Once | INTRAVENOUS | Status: AC
  Administered 2018-03-17: 500 mL via INTRAVENOUS

## 2018-03-16 MED ORDER — LIDOCAINE HCL (PF) 1 % IJ SOLN
INTRAMUSCULAR | Status: DC | PRN
  Administered 2018-03-16: 7 mL via EPIDURAL
  Administered 2018-03-16: 5 mL via EPIDURAL

## 2018-03-16 MED ORDER — LACTATED RINGERS IV SOLN
INTRAVENOUS | Status: DC
  Administered 2018-03-16: 20:00:00 via INTRAVENOUS

## 2018-03-16 NOTE — MAU Note (Signed)
Pt DC'd home from MAU this morning, states contractions are more intense, denies bleeding or LOF.  Reports good fetal movement.

## 2018-03-16 NOTE — Telephone Encounter (Signed)
[facility] this mourning, she said the contractions are still very strong and wanted to know if she should go back?

## 2018-03-16 NOTE — Anesthesia Preprocedure Evaluation (Signed)
Anesthesia Evaluation  Patient identified by MRN, date of birth, ID band Patient awake    Reviewed: Allergy & Precautions, H&P , NPO status , Patient's Chart, lab work & pertinent test results  Airway Mallampati: II  TM Distance: >3 FB Neck ROM: full    Dental no notable dental hx. (+) Teeth Intact   Pulmonary neg pulmonary ROS,    Pulmonary exam normal breath sounds clear to auscultation       Cardiovascular negative cardio ROS Normal cardiovascular exam Rhythm:regular Rate:Normal     Neuro/Psych negative psych ROS   GI/Hepatic negative GI ROS, Neg liver ROS,   Endo/Other  negative endocrine ROS  Renal/GU negative Renal ROS  negative genitourinary   Musculoskeletal negative musculoskeletal ROS (+)   Abdominal (+) + obese,   Peds negative pediatric ROS (+)  Hematology negative hematology ROS (+)   Anesthesia Other Findings   Reproductive/Obstetrics (+) Pregnancy                             Anesthesia Physical Anesthesia Plan  ASA: II  Anesthesia Plan: Epidural   Post-op Pain Management:    Induction:   PONV Risk Score and Plan:   Airway Management Planned:   Additional Equipment:   Intra-op Plan:   Post-operative Plan:   Informed Consent: I have reviewed the patients History and Physical, chart, labs and discussed the procedure including the risks, benefits and alternatives for the proposed anesthesia with the patient or authorized representative who has indicated his/her understanding and acceptance.     Plan Discussed with:   Anesthesia Plan Comments:         Anesthesia Quick Evaluation

## 2018-03-16 NOTE — Telephone Encounter (Signed)
LMOVM returning patient's call.  Advised she could come to our office for a [facility] check or [facility] if it was closer.
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
## 2018-03-16 NOTE — Progress Notes (Signed)
Labor Progress Note Brittany Kim is a 17 y.o. G1P0000 at 685w5d presented for SOL.  S: Feeling comfortable after epidural placement.   O:  BP 114/76   Pulse (!) 101   Temp 97.8 F (36.6 C) (Oral)   Resp 16   LMP 06/11/2017   SpO2 100%  EFM: baseline 145 bpm/moderate variability/+accel, + isolated decel with AROM otherwise reassuring  CVE: Dilation: (P) 8 Effacement (%): (P) 90 Cervical Position: Middle Station: 0 Presentation: (P) Vertex Exam by:: Brittany PullerSade Harper,RN   A&P: 17 y.o. G1P0000 685w5d presenting in SOL. #Labor: Progressing well. SVE now 8/90/0. AROM with light mec at 22:45. Anticipate SVD. #Pain: epidural #FWB: cat II FHT for isolated decel during AROM, otherwise reassuring #GBS negative  Verdene LennertBrittany P Kaleeyah Cuffie, MD 10:50 PM

## 2018-03-16 NOTE — MAU Note (Signed)
Contractions starting around 0300.  Less than 5 mins apart. Denies LOF or bleeding. +FM.

## 2018-03-16 NOTE — Discharge Instructions (Signed)
Braxton Hicks Contractions °Contractions of the uterus can occur throughout pregnancy, but they are not always a sign that you are in labor. You may have practice contractions called Braxton Hicks contractions. These false labor contractions are sometimes confused with true labor. °What are Braxton Hicks contractions? °Braxton Hicks contractions are tightening movements that occur in the muscles of the uterus before labor. Unlike true labor contractions, these contractions do not result in opening (dilation) and thinning of the cervix. Toward the end of pregnancy (32-34 weeks), Braxton Hicks contractions can happen more often and may become stronger. These contractions are sometimes difficult to tell apart from true labor because they can be very uncomfortable. You should not feel embarrassed if you go to the hospital with false labor. °Sometimes, the only way to tell if you are in true labor is for your health care provider to look for changes in the cervix. The health care provider will do a physical exam and may monitor your contractions. If you are not in true labor, the exam should show that your cervix is not dilating and your water has not broken. °If there are other health problems associated with your pregnancy, it is completely safe for you to be sent home with false labor. You may continue to have Braxton Hicks contractions until you go into true labor. °How to tell the difference between true labor and false labor °True labor °· Contractions last 30-70 seconds. °· Contractions become very regular. °· Discomfort is usually felt in the top of the uterus, and it spreads to the lower abdomen and low back. °· Contractions do not go away with walking. °· Contractions usually become more intense and increase in frequency. °· The cervix dilates and gets thinner. °False labor °· Contractions are usually shorter and not as strong as true labor contractions. °· Contractions are usually irregular. °· Contractions  are often felt in the front of the lower abdomen and in the groin. °· Contractions may go away when you walk around or change positions while lying down. °· Contractions get weaker and are shorter-lasting as time goes on. °· The cervix usually does not dilate or become thin. °Follow these instructions at home: °· Take over-the-counter and prescription medicines only as told by your health care provider. °· Keep up with your usual exercises and follow other instructions from your health care provider. °· Eat and drink lightly if you think you are going into labor. °· If Braxton Hicks contractions are making you uncomfortable: °? Change your position from lying down or resting to walking, or change from walking to resting. °? Sit and rest in a tub of warm water. °? Drink enough fluid to keep your urine pale yellow. Dehydration may cause these contractions. °? Do slow and deep breathing several times an hour. °· Keep all follow-up prenatal visits as told by your health care provider. This is important. °Contact a health care provider if: °· You have a fever. °· You have continuous pain in your abdomen. °Get help right away if: °· Your contractions become stronger, more regular, and closer together. °· You have fluid leaking or gushing from your vagina. °· You pass blood-tinged mucus (bloody show). °· You have bleeding from your vagina. °· You have low back pain that you never had before. °· You feel your baby’s head pushing down and causing pelvic pressure. °· Your baby is not moving inside you as much as it used to. °Summary °· Contractions that occur before labor are called Braxton   Hicks contractions, false labor, or practice contractions. °· Braxton Hicks contractions are usually shorter, weaker, farther apart, and less regular than true labor contractions. True labor contractions usually become progressively stronger and regular and they become more frequent. °· Manage discomfort from Braxton Hicks contractions by  changing position, resting in a warm bath, drinking plenty of water, or practicing deep breathing. °This information is not intended to replace advice given to you by your health care provider. Make sure you discuss any questions you have with your health care provider. °Document Released: 02/04/2017 Document Revised: 02/04/2017 Document Reviewed: 02/04/2017 °Elsevier Interactive Patient Education © 2018 [employer] Inc[employer] ° °

## 2018-03-16 NOTE — MAU Note (Signed)
Closer and stronger, <5 min. Bloody show, no leaking.

## 2018-03-16 NOTE — Anesthesia Procedure Notes (Signed)
Epidural Patient location during procedure: OB Start time: 03/16/2018 7:40 PM End time: 03/16/2018 7:45 PM  Staffing Anesthesiologist: Leilani AbleHatchett, Fritz Cauthon, MD Performed: anesthesiologist   Preanesthetic Checklist Completed: patient identified, site marked, surgical consent, pre-op evaluation, timeout performed, IV checked, risks and benefits discussed and monitors and equipment checked  Epidural Patient position: sitting Prep: site prepped and draped and DuraPrep Patient monitoring: continuous pulse ox and blood pressure Approach: midline Location: L3-L4 Injection technique: LOR air  Needle:  Needle type: Tuohy  Needle gauge: 17 G Needle length: 9 cm and 9 Needle insertion depth: 6 cm Catheter type: closed end flexible Catheter size: 19 Gauge Catheter at skin depth: 11 cm Test dose: negative and Other  Assessment Sensory level: T9 Events: blood not aspirated, injection not painful, no injection resistance, negative IV test and no paresthesia  Additional Notes Reason for block:procedure for pain

## 2018-03-16 NOTE — MAU Note (Signed)
I have communicated with Dr. Mearl LatinHipkins and reviewed vital signs:  Vitals:   03/16/18 0550 03/16/18 0643  BP: 118/81 129/73  Pulse: 93   Resp: 18   Temp: 97.8 F (36.6 C)   SpO2: 100%     Vaginal exam:  Dilation: 3 Effacement (%): 70 Cervical Position: Middle Station: -2 Presentation: Vertex Exam by:: Bari Mantis Akiem Urieta RN ,   Also reviewed contraction pattern and that non-stress test is reactive.  It has been documented that the  patient is contracting irregularly with minimal  cervical change since last week not indicating active labor.  Patient denies any other complaints.  Based on this report provider has given order for discharge.  A discharge order and diagnosis entered by a provider.   Labor discharge instructions reviewed with patient.

## 2018-03-17 ENCOUNTER — Encounter (HOSPITAL_COMMUNITY): Payer: Self-pay | Admitting: *Deleted

## 2018-03-17 ENCOUNTER — Encounter: Payer: Medicaid Other | Admitting: Advanced Practice Midwife

## 2018-03-17 DIAGNOSIS — Z3A39 39 weeks gestation of pregnancy: Secondary | ICD-10-CM

## 2018-03-17 LAB — ABO/RH: ABO/RH(D): AB POS

## 2018-03-17 LAB — RPR: RPR: NONREACTIVE

## 2018-03-17 MED ORDER — ZOLPIDEM TARTRATE 5 MG PO TABS: 5.0000 mg | ORAL_TABLET | Freq: Every evening | ORAL | Status: DC | PRN

## 2018-03-17 MED ORDER — IBUPROFEN 600 MG PO TABS
600.0000 mg | ORAL_TABLET | Freq: Four times a day (QID) | ORAL | Status: DC
  Administered 2018-03-17 – 2018-03-19 (×9): 600 mg via ORAL
  Filled 2018-03-17 (×10): qty 1

## 2018-03-17 MED ORDER — SENNOSIDES-DOCUSATE SODIUM 8.6-50 MG PO TABS
2.0000 | ORAL_TABLET | ORAL | Status: DC
  Administered 2018-03-17 – 2018-03-19 (×2): 2 via ORAL
  Filled 2018-03-17 (×2): qty 2

## 2018-03-17 MED ORDER — DIBUCAINE 1 % RE OINT
1.0000 "application " | TOPICAL_OINTMENT | RECTAL | Status: DC | PRN
  Filled 2018-03-17: qty 28

## 2018-03-17 MED ORDER — WITCH HAZEL-GLYCERIN EX PADS: 1.0000 "application " | MEDICATED_PAD | CUTANEOUS | Status: DC | PRN

## 2018-03-17 MED ORDER — BENZOCAINE-MENTHOL 20-0.5 % EX AERO
1.0000 "application " | INHALATION_SPRAY | CUTANEOUS | Status: DC | PRN
  Administered 2018-03-17: 1 via TOPICAL
  Filled 2018-03-17 (×2): qty 56

## 2018-03-17 MED ORDER — ONDANSETRON HCL 4 MG PO TABS: 4.0000 mg | ORAL_TABLET | ORAL | Status: DC | PRN

## 2018-03-17 MED ORDER — PRENATAL MULTIVITAMIN CH
1.0000 | ORAL_TABLET | Freq: Every day | ORAL | Status: DC
  Administered 2018-03-17 – 2018-03-19 (×3): 1 via ORAL
  Filled 2018-03-17 (×3): qty 1

## 2018-03-17 MED ORDER — COCONUT OIL OIL
1.0000 "application " | TOPICAL_OIL | Status: DC | PRN
  Administered 2018-03-18: 1 via TOPICAL
  Filled 2018-03-17 (×2): qty 120

## 2018-03-17 MED ORDER — SIMETHICONE 80 MG PO CHEW: 80.0000 mg | CHEWABLE_TABLET | ORAL | Status: DC | PRN

## 2018-03-17 MED ORDER — ACETAMINOPHEN 325 MG PO TABS
650.0000 mg | ORAL_TABLET | ORAL | Status: DC | PRN
  Administered 2018-03-17 – 2018-03-18 (×4): 650 mg via ORAL
  Filled 2018-03-17 (×4): qty 2

## 2018-03-17 MED ORDER — TETANUS-DIPHTH-ACELL PERTUSSIS 5-2.5-18.5 LF-MCG/0.5 IM SUSP: 0.5000 mL | Freq: Once | INTRAMUSCULAR | Status: DC

## 2018-03-17 MED ORDER — ONDANSETRON HCL 4 MG/2ML IJ SOLN: 4.0000 mg | INTRAMUSCULAR | Status: DC | PRN

## 2018-03-17 MED ORDER — DIPHENHYDRAMINE HCL 25 MG PO CAPS: 25.0000 mg | ORAL_CAPSULE | Freq: Four times a day (QID) | ORAL | Status: DC | PRN

## 2018-03-17 NOTE — Lactation Note (Signed)
This note was copied from a baby's chart. Lactation Consultation Note  Patient Name: Brittany Kim Today's Date: 03/17/2018 Reason for consult: Initial assessment;Term;Primapara;1st time breastfeeding  Baby is 8411 hours old  LC reviewed and updated the doc flow sheets per mom and the consult results  LC changed wet and 2 mec stools , stool thinning. LC instructed dad how to change Diaper, burp baby, and use bulb syringe due to baby being spitty( small amount mucous - clear )  LC showed mom how to hand express and she repeated the hand expressing with results drops.  LC showed mom and dad how to spoon feed, .2 ml.  LC noted the areolas to have some edema and shells are indicted between feedings except when sleeping.  LC asked Brittany Kim MBURN to instruct mom how to use shells.  LC assisted to latch/ baby latched without sucking, wide open mouth. LC recommended STS until baby shows  Feeding cues. Baby has breast fed x 2 15 -20 mins per doc flow sheets, and voided x 1  and stools x 4.  LC reassured mom and dad baby has 24 hours to get feedings consistent.  Per mom attended [place] [facility] breast feeding class. And is still active.  Mother informed of post-discharge support and given phone number to the lactation department, including services for phone call assistance; out-patient appointments; and breastfeeding support group. List of other breastfeeding resources in the community given in the handout. Encouraged mother to call for problems or concerns related to breastfeeding.    Maternal Data Has patient been taught Hand Expression?: Yes Does the patient have breastfeeding experience prior to this delivery?: No  Feeding Feeding Type: Breast Fed Length of feed: (latched / no sucks )  LATCH Score Latch: Repeated attempts needed to sustain latch, nipple held in mouth throughout feeding, stimulation needed to elicit sucking reflex.  Audible Swallowing: None  Type of Nipple: Everted  at rest and after stimulation(semi - compressible - inidicating need shells / RN aware to instruct mom when baby is not STS )  Comfort (Breast/Nipple): Soft / non-tender  Hold (Positioning): Assistance needed to correctly position infant at breast and maintain latch.  LATCH Score: 6  Interventions Interventions: Breast feeding basics reviewed;Assisted with latch;Skin to skin;Breast massage;Reverse pressure;Adjust position;Support pillows;Position options;Expressed milk  Lactation Tools Discussed/Used WIC Program: Yes   Consult Status Consult Status: Follow-up Date: 03/18/18 Follow-up type: In-patient    Brittany Kim 03/17/2018, 3:18 PM

## 2018-03-17 NOTE — Progress Notes (Signed)
Labor Progress Note Brittany Kim is a 17 y.o. G1P0000 at 6540w6d presented for SOL.  S: Feeling comfortable after epidural placement. Feeling some pressure.   O:  BP 125/84   Pulse 97   Temp 98.8 F (37.1 C) (Oral)   Resp 16   LMP 06/11/2017   SpO2 100%  EFM: baseline 130 bpm/moderate variability/+accel, no decel  CVE: 10/100/0 per nurse   A&P: 17 y.o. G1P0000 2240w6d presenting in SOL. #Labor: Progressing well. SVE now 10/100/0. AROM with light mec at 22:45. Pt did some practice pushes without much descent. Will labor down and resume pushing in 1 hour.  #Pain: epidural #FWB: Cat 1 FHT #GBS negative  [place]renadaBrittany P Monseratt Ledin, MD 2:25 AM

## 2018-03-17 NOTE — Lactation Note (Signed)
This note was copied from a baby's chart. Lactation Consultation Note  Patient Name: Brittany Kim Today's Date: 03/17/2018 Reason for consult: Follow-up assessment;Primapara;1st time breastfeeding;Term  7919 hours old FT female who is being exclusively BF by her mother, she's a P1. Previous LC noticed some areola edema and instructed her day RN to get some shells for her, but mom didn't have breast shells yet, LC gave mom breast shells, she'll start wearing them tomorrow morning. Breast shells instructions, cleaning and storage was reviewed. When LC came back from getting the shells mo had baby latched to the left breast in cradle position but the latch was shallow. Advised mom to correct positioning, and slightly tilt baby's head placing her in a cross cradle instead of a classic cradle hold. After mom did that, baby was latched deeply onto the breast with a wide open mouth and flanged lips. A few swallows were heard, baby still nursing when exiting the room. Encouraged mom to keep feeding baby STS 8-12 times/24 hours and call of assistance if needed.  Maternal Data    Feeding Feeding Type: Breast Fed Length of feed: 5 min(baby still nursing when exiting the room)  LATCH Score Latch: Grasps breast easily, tongue down, lips flanged, rhythmical sucking.  Audible Swallowing: A few with stimulation  Type of Nipple: Everted at rest and after stimulation  Comfort (Breast/Nipple): Soft / non-tender  Hold (Positioning): Assistance needed to correctly position infant at breast and maintain latch.  LATCH Score: 8  Interventions Interventions: Skin to skin;Assisted with latch;Breast feeding basics reviewed;Breast massage;Breast compression;Adjust position;Shells  Lactation Tools Discussed/Used Tools: Shells Shell Type: Inverted   Consult Status Consult Status: Follow-up Date: 03/18/18 Follow-up type: In-patient    Brittany Kim 03/17/2018, 11:35 PM

## 2018-03-17 NOTE — Anesthesia Postprocedure Evaluation (Signed)
Anesthesia Post Note  Patient: Kiyra N Rowser  Procedure(s) Performed: AN AD HOC LABOR EPIDURAL     Patient location during evaluation: Mother Baby Anesthesia Type: Epidural Level of consciousness: awake and alert and oriented Pain management: satisfactory to patient Vital Signs Assessment: post-procedure vital signs reviewed and stable Respiratory status: respiratory function stable Cardiovascular status: stable Postop Assessment: no headache, no backache, epidural receding, patient able to bend at knees, no signs of nausea or vomiting and adequate PO intake Anesthetic complications: no    Last Vitals:  Vitals:   03/17/18 0810 03/17/18 0923  BP: 121/77 (!) 98/57  Pulse: (!) 107 96  Resp: 18 16  Temp: 37.2 C 36.8 C  SpO2: 100% 100%    Last Pain:  Vitals:   03/17/18 0923  TempSrc: Oral  PainSc: 0-No pain   Pain Goal:                 Nishawn Rotan

## 2018-03-18 LAB — BIRTH TISSUE RECOVERY COLLECTION (PLACENTA DONATION)

## 2018-03-18 NOTE — Lactation Note (Signed)
This note was copied from a baby's chart. Lactation Consultation Note  Patient Name: Brittany Eustace QuailClareece Kim Today's Date: 03/18/2018 Reason for consult: Follow-up assessment Mom is wearing breast shells.  She states baby is latching easily and feeding well.  Instructed to feed with any feeding cue and call for assist/concerns prn  Maternal Data    Feeding    LATCH Score                   Interventions    Lactation Tools Discussed/Used     Consult Status Consult Status: Follow-up Date: 03/19/18 Follow-up type: In-patient    Huston FoleyMOULDEN, Hinley Brimage S 03/18/2018, 4:11 PM

## 2018-03-18 NOTE — Progress Notes (Addendum)
POSTPARTUM PROGRESS NOTE  Post Partum Day 1  Subjective:  Brittany Kim is a 17 y.o. G1P1001 s/p SVD at [redacted]w[redacted]d  She reports she is doing well. No acute events overnight. She denies any problems with ambulating, voiding or po intake. Denies nausea or vomiting.  Pain is well controlled.  Lochia is normal.  Objective: Blood pressure 102/70, pulse 88, temperature 98.9 F (37.2 C), temperature source Oral, resp. rate 16, last menstrual period 06/11/2017, SpO2 99 %, unknown if currently breastfeeding.  Physical Exam:  General: alert, cooperative and no distress Chest: no respiratory distress Heart:regular rate, distal pulses intact Abdomen: soft, nontender,  Uterine Fundus: firm, appropriately tender DVT Evaluation: No calf swelling or tenderness Extremities: no edema Skin: warm, dry  Recent Labs    03/16/18 1855  HGB 13.4  HCT 37.6    Assessment/Plan: Hayes N BMiyamotois a 17y.o. G1P1001 s/p SVD at 310w6d  PPD#1 - Doing well. Routine postpartum care Contraception: IUD - to be placed as an outpatient.  Feeding: breast Dispo: Plan for discharge tomorrow on PPD.   LOS: 2 days   BrWebster/14/2019, 5:55 AM    I have spoken with and examined this patient and agree with resident/PA-S/Med-S/SNM's note and plan of care. VSS, HRR&R, Resp unlabored, Legs neg.  FrNigel BertholdCNM 03/18/2018 8:40 AM

## 2018-03-19 MED ORDER — IBUPROFEN 600 MG PO TABS: 600.0000 mg | ORAL_TABLET | Freq: Four times a day (QID) | ORAL | 0 refills | Status: DC

## 2018-03-19 NOTE — Discharge Summary (Addendum)
OB Discharge Summary     Patient Name: Brittany Kim DOB: 07/08/2001 MRN: [id number]  Date of admission: 03/16/2018 Delivering MD: Brittany Kim   Date of discharge: 03/19/2018  Admitting diagnosis: 40WKS CTX Intrauterine pregnancy: 2290w6d     Secondary diagnosis:  Active Problems:   Supervision of normal first pregnancy   Normal labor   Spontaneous vaginal delivery  Additional problems: none     Discharge diagnosis: Term Pregnancy Delivered                                                                                                Post partum procedures:none  Augmentation: AROM  Complications: None  Hospital course:  Onset of Labor With Vaginal Delivery     17 y.o. yo G1P1001 at 5690w6d was admitted in Active Labor on 03/16/2018. Patient had an uncomplicated labor course as follows:  Membrane Rupture Time/Date: 10:45 PM ,03/16/2018   Intrapartum Procedures: Episiotomy: None [1]                                         Lacerations:  2nd degree [3];Labial [10]  Patient had a delivery of a Viable infant. 03/17/2018  Information for the patient's newborn:  Brittany Kim [[id number]][[id number]]  Delivery Method: Vaginal, Spontaneous(Filed from Delivery Summary)    Pateint had an uncomplicated postpartum course.  She is ambulating, tolerating a regular diet, passing flatus, and urinating well. Patient is discharged home in stable condition on 03/19/18.   Physical exam  Vitals:   03/18/18 0515 03/18/18 1318 03/18/18 2247 03/19/18 0545  BP: 102/70 107/69 115/69 111/73  Pulse: 88 96 99 86  Resp: 16 16 17 18   Temp: 98.9 F (37.2 C) 99.1 F (37.3 C) 98.1 F (36.7 C) 97.9 F (36.6 C)  TempSrc: Oral Oral  Oral  SpO2:   97%    General: alert, cooperative and no distress Lochia: appropriate Uterine Fundus: firm Incision: N/A DVT Evaluation: No evidence of DVT seen on physical exam. No cords or calf tenderness. No significant calf/ankle edema. Labs: Lab Results   Component Value Date   WBC 19.6 (H) 03/16/2018   HGB 13.4 03/16/2018   HCT 37.6 03/16/2018   MCV 90.6 03/16/2018   PLT 175 03/16/2018   CMP Latest Ref Rng & Units 05/10/2014  Glucose 70 - 99 mg/dL 92  BUN 6 - 23 mg/dL 5(L)  Creatinine 2.130.47 - 1.00 mg/dL 0.860.62  Sodium 578137 - 469147 mEq/L 139  Potassium 3.7 - 5.3 mEq/L 4.1  Chloride 96 - 112 mEq/L 107  CO2 19 - 32 mEq/L 20  Calcium 8.4 - 10.5 mg/dL 8.3(L)  Total Protein 6.0 - 8.3 g/dL -  Total Bilirubin 0.3 - 1.2 mg/dL -  Alkaline Phos 50 - 629162 U/L -  AST 0 - 37 U/L -  ALT 0 - 35 U/L -    Discharge instruction: per After Visit Summary and "Baby and Me Booklet".  After visit meds:  Allergies as of 03/19/2018   No  Known Allergies     Medication List    TAKE these medications   ibuprofen 600 MG tablet Commonly known as:  ADVIL,MOTRIN Take 1 tablet (600 mg total) by mouth every 6 (six) hours.   prenatal multivitamin Tabs tablet Take 1 tablet by mouth daily at 12 noon.       Diet: routine diet  Activity: Advance as tolerated. Pelvic rest for 6 weeks.   Outpatient follow up:4 weeks Follow up Appt: Future Appointments  Date Time Provider Department Center  04/21/2018  2:00 PM Cresenzo-Dishmon, Scarlette Calico, CNM [facility]   Follow up Visit:No follow-ups on file.  Postpartum contraception: IUD Mirena  Newborn Data: Live born female  Birth Weight: 7 lb 7.9 oz (3400 g) APGAR: 8, 9  Newborn Delivery   Birth date/time:  03/17/2018 03:56:00 Delivery type:  Vaginal, Spontaneous     Baby Feeding: Breast Disposition:home with mother   03/19/2018 Brittany Gage, MD   OB FELLOW DISCHARGE ATTESTATION  I have seen and examined this patient. I agree with above documentation and have made edits as needed.   Brittany Ada, DO OB Fellow 8:45 AM

## 2018-03-19 NOTE — Lactation Note (Signed)
This note was copied from a baby's chart. Lactation Consultation Note  Patient Name: Girl Eustace QuailClareece Pesola Today's Date: 03/19/2018 Reason for consult: Follow-up assessment Baby is 5853 hours old and at a 7% weight loss.  Mom reports feedings are going well.  Discussed milk coming to volume and engorgement treatment.  Mom asking how long she should wear shells.  Instructed to wear until breasts become full.  Lactation outpatient services and support information reviewed and encouraged prn.  Maternal Data    Feeding    LATCH Score                   Interventions    Lactation Tools Discussed/Used     Consult Status Consult Status: Complete Follow-up type: Call as needed    Huston FoleyMOULDEN, Jenella Craigie S 03/19/2018, 9:52 AM

## 2018-04-06 ENCOUNTER — Telehealth: Payer: Self-pay | Admitting: Obstetrics and Gynecology

## 2018-04-06 NOTE — Telephone Encounter (Signed)
LMOVM to take a stool softner daily along with increasing diet in fiber.  Advised she could also use tucks pads or preparation H for the hemorrhoids.

## 2018-04-06 NOTE — Telephone Encounter (Signed)
Has been constipated since del on and off/ please call pt and advise what she can take she is breast feeding, also having issues with he hemmroids

## 2018-04-21 ENCOUNTER — Ambulatory Visit (INDEPENDENT_AMBULATORY_CARE_PROVIDER_SITE_OTHER): Payer: Medicaid Other | Admitting: Advanced Practice Midwife

## 2018-04-21 ENCOUNTER — Encounter: Payer: Self-pay | Admitting: Advanced Practice Midwife

## 2018-04-21 NOTE — Progress Notes (Signed)
Brittany Kim is a 17 y.o. who presents for a postpartum visit. She is 5 weeks postpartum following a spontaneous vaginal delivery. I have fully reviewed the prenatal and intrapartum course. The delivery was at 39.6 gestational weeks.  Anesthesia: epidural. Postpartum course has been uneventful. Baby's course has been uneventful. Baby is feeding by breast. Bleeding: staining only. Bowel function is normal. Bladder function is normal. Patient is not sexually active. Contraception method is abstinence. Postpartum depression screening: negative.   Current Outpatient Medications:  .  ibuprofen (ADVIL,MOTRIN) 600 MG tablet, Take 1 tablet (600 mg total) by mouth every 6 (six) hours. (Patient taking differently: Take 600 mg by mouth as needed. ), Disp: 30 tablet, Rfl: 0 .  Prenatal Vit-Fe Fumarate-FA (PRENATAL MULTIVITAMIN) TABS tablet, Take 1 tablet by mouth daily at 12 noon., Disp: , Rfl:   Review of Systems   Constitutional: Negative for fever and chills Eyes: Negative for visual disturbances Respiratory: Negative for shortness of breath, dyspnea Cardiovascular: Negative for chest pain or palpitations  Gastrointestinal: Negative for vomiting, diarrhea and constipation Genitourinary: Negative for dysuria and urgency Musculoskeletal: Negative for back pain, joint pain, myalgias  Neurological: Negative for dizziness and headaches    Objective:     Vitals:   04/21/18 1420  BP: 111/70  Pulse: (!) 106   General:  alert, cooperative and no distress   Breasts:  negative  Lungs: Normal respiratory effort  Heart:  regular rate and rhythm  Abdomen: Soft, nontender   Vulva:  normal  Vagina: normal vagina  Cervix:  closed  Corpus: Well involuted     Rectal Exam: no hemorrhoids        Assessment:    normal postpartum exam.  Plan:   1. Contraception: IUD 2. Follow up in: 1 weeks or as needed.

## 2018-04-28 ENCOUNTER — Encounter: Payer: Medicaid Other | Admitting: Women's Health

## 2018-04-29 NOTE — Progress Notes (Signed)
  This encounter was created in error, pt had sex, IUD can't be placed - please disregard.

## 2018-05-04 ENCOUNTER — Ambulatory Visit (INDEPENDENT_AMBULATORY_CARE_PROVIDER_SITE_OTHER): Payer: Medicaid Other | Admitting: Obstetrics and Gynecology

## 2018-05-04 ENCOUNTER — Encounter: Payer: Self-pay | Admitting: Obstetrics and Gynecology

## 2018-05-04 ENCOUNTER — Other Ambulatory Visit: Payer: Medicaid Other

## 2018-05-04 VITALS — BP 112/78 | HR 87 | Ht 66.0 in | Wt 196.4 lb

## 2018-05-04 DIAGNOSIS — Z30430 Encounter for insertion of intrauterine contraceptive device: Secondary | ICD-10-CM | POA: Diagnosis not present

## 2018-05-04 DIAGNOSIS — Z3202 Encounter for pregnancy test, result negative: Secondary | ICD-10-CM

## 2018-05-04 LAB — POCT URINE PREGNANCY: Preg Test, Ur: NEGATIVE

## 2018-05-04 MED ORDER — LEVONORGESTREL 19.5 MCG/DAY IU IUD
INTRAUTERINE_SYSTEM | Freq: Once | INTRAUTERINE | Status: AC
  Administered 2018-05-04: 1 via INTRAUTERINE

## 2018-05-04 NOTE — Progress Notes (Signed)
Patient ID: Brittany Kim, female   DOB: 03/15/2001, 17 y.o.   MRN: [id number]     GYNECOLOGY OFFICE PROCEDURE NOTE  [name] is a 17 y.o. G1P1001 here for Liletta IUD insertion. No GYN concerns.  Has not had a pap. She had been sexually active last week but has not been since. She insisted on waiting until she had IUD implant IUD Insertion Procedure Note Patient identified, informed consent performed, consent signed.   Discussed risks of irregular bleeding, cramping, infection, malpositioning or misplacement of the IUD outside the uterus which may require further procedure such as laparoscopy. Time out was performed.  Urine pregnancy test negative.  Speculum placed in the vagina.  Cervix visualized.  Cleaned with Betadine x 2.  Grasped anteriorly with a single tooth tenaculum.  Uterus sounded to 8.5 cm.  Liletta IUD placed per manufacturer's recommendations.  Strings trimmed to 3 cm. Tenaculum was removed, good hemostasis noted.  Patient tolerated procedure well. She has retroverted uterus and IUD is within 1 cm of the top of the uterus, centrally located in endometrial cavity based on u/s done at end of procedure (POCT- No chg_)   Patient was given post-procedure instructions.  She was advised to have backup contraception for one week.  Patient was also asked to check IUD strings periodically and follow up in 4 weeks for IUD check.  By signing my name below, I, Arnette NorrisMari Johnson, attest that this documentation has been prepared under the direction and in the presence of Tilda BurrowFerguson, Journie Howson V, MD. Electronically Signed: Arnette NorrisMari Johnson Medical Scribe. 05/04/18. 2:07 PM.  I personally performed the services described in this documentation, which was SCRIBED in my presence. The recorded information has been reviewed and considered accurate. It has been edited as necessary during review. Tilda BurrowJohn V Mannat Benedetti, MD

## 2018-06-01 ENCOUNTER — Ambulatory Visit (INDEPENDENT_AMBULATORY_CARE_PROVIDER_SITE_OTHER): Payer: Medicaid Other | Admitting: Obstetrics and Gynecology

## 2018-06-01 ENCOUNTER — Encounter: Payer: Self-pay | Admitting: Obstetrics and Gynecology

## 2018-06-01 ENCOUNTER — Other Ambulatory Visit: Payer: Self-pay

## 2018-06-01 VITALS — BP 104/69 | HR 84 | Ht 67.0 in | Wt 196.0 lb

## 2018-06-01 DIAGNOSIS — Z30431 Encounter for routine checking of intrauterine contraceptive device: Secondary | ICD-10-CM

## 2018-06-01 NOTE — Progress Notes (Addendum)
Patient ID: Brittany Kim, female   DOB: 04/03/2001, 17 y.o.   MRN: [id number]    GYNECOLOGY OFFICE PROGRESS NOTE  History:  17 y.o. G1P1001 here today for today for IUD string check; Liletta IUD was placed  05/04/18. No complaints about the IUD, no concerning side effects. She has resumed sexual activity. She is still having bleeding and finds it weird whenever she puts in a tampon but has no other concerns.  transvaginal U/s is used : showed- IUD prongs properly deployed at top of uterus endometrial cavity.  The following portions of the patient's history were reviewed and updated as appropriate: allergies, current medications, past family history, past medical history, past social history, past surgical history and problem list. Last pap smear; Pt is under 21.  Review of Systems:  Pertinent items are noted in HPI.   Objective:  Physical Exam currently breastfeeding. CONSTITUTIONAL: Well-developed, well-nourished female in no acute distress.  HENT:  Normocephalic, atraumatic. External right and left ear normal. Oropharynx is clear and moist EYES: Conjunctivae and EOM are normal. Pupils are equal, round, and reactive to light. No scleral icterus.  NECK: Normal range of motion, supple, no masses CARDIOVASCULAR: Normal heart rate noted RESPIRATORY: Effort and breath sounds normal, no problems with respiration noted ABDOMEN: Soft, no distention noted.   PELVIC: Normal appearing external genitalia; normal appearing vaginal mucosa and cervix.  IUD strings not visualized, feels palpable digitially outside cervix.   Assessment & Plan:  Normal IUD check. Patient to keep IUD in place for five years; can come in for removal if she desires pregnancy within the next five years. Routine preventative health maintenance measures emphasized.   By signing my name below, I, Arnette NorrisMari Johnson, attest that this documentation has been prepared under the direction and in the presence of Tilda BurrowFerguson, Lafreda Casebeer V,  MD. Electronically Signed: Arnette NorrisMari Johnson Medical Scribe. 06/01/18. 1:57 PM.  I personally performed the services described in this documentation, which was SCRIBED in my presence. The recorded information has been reviewed and considered accurate. It has been edited as necessary during review. Tilda BurrowJohn V Patience Nuzzo, MD

## 2018-07-28 ENCOUNTER — Ambulatory Visit (INDEPENDENT_AMBULATORY_CARE_PROVIDER_SITE_OTHER): Payer: Medicaid Other | Admitting: Advanced Practice Midwife

## 2018-07-28 ENCOUNTER — Encounter: Payer: Self-pay | Admitting: Advanced Practice Midwife

## 2018-07-28 VITALS — BP 99/70 | HR 86 | Ht 67.0 in | Wt 203.0 lb

## 2018-07-28 DIAGNOSIS — Z30432 Encounter for removal of intrauterine contraceptive device: Secondary | ICD-10-CM

## 2018-07-28 MED ORDER — NORETHINDRONE 0.35 MG PO TABS: 1.0000 | ORAL_TABLET | Freq: Every day | ORAL | 11 refills | Status: DC
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
## 2019-03-01 ENCOUNTER — Other Ambulatory Visit: Payer: Self-pay

## 2019-03-01 ENCOUNTER — Encounter: Payer: Self-pay | Admitting: Adult Health

## 2019-03-01 ENCOUNTER — Ambulatory Visit (INDEPENDENT_AMBULATORY_CARE_PROVIDER_SITE_OTHER): Payer: Medicaid Other | Admitting: Adult Health

## 2019-03-01 VITALS — Ht 67.0 in

## 2019-03-01 DIAGNOSIS — Z3201 Encounter for pregnancy test, result positive: Secondary | ICD-10-CM

## 2019-03-01 DIAGNOSIS — O3680X0 Pregnancy with inconclusive fetal viability, not applicable or unspecified: Secondary | ICD-10-CM | POA: Diagnosis not present

## 2019-03-01 DIAGNOSIS — Z3A01 Less than 8 weeks gestation of pregnancy: Secondary | ICD-10-CM | POA: Insufficient documentation

## 2019-03-01 MED ORDER — PRENATAL PLUS 27-1 MG PO TABS: 1.0000 | ORAL_TABLET | Freq: Every day | ORAL | 12 refills | Status: DC
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
## 2019-03-22 ENCOUNTER — Ambulatory Visit (INDEPENDENT_AMBULATORY_CARE_PROVIDER_SITE_OTHER): Payer: Medicaid Other

## 2019-03-22 ENCOUNTER — Other Ambulatory Visit: Payer: Self-pay

## 2019-03-22 DIAGNOSIS — Z3A08 8 weeks gestation of pregnancy: Secondary | ICD-10-CM

## 2019-03-22 DIAGNOSIS — O3680X0 Pregnancy with inconclusive fetal viability, not applicable or unspecified: Secondary | ICD-10-CM

## 2019-03-22 NOTE — Progress Notes (Signed)
[name] 8+3 wks,single IUP w/ys,positive fht 166 bpm,normal ovaries bilat,subchorionic hemorrhage 2.7 x 1.7 x 2.2 cm,crl 19.06 mm

## 2019-04-18 ENCOUNTER — Other Ambulatory Visit: Payer: Self-pay | Admitting: Obstetrics & Gynecology

## 2019-04-18 DIAGNOSIS — Z3682 Encounter for antenatal screening for nuchal translucency: Secondary | ICD-10-CM

## 2019-04-19 ENCOUNTER — Ambulatory Visit (INDEPENDENT_AMBULATORY_CARE_PROVIDER_SITE_OTHER): Payer: Medicaid Other

## 2019-04-19 ENCOUNTER — Other Ambulatory Visit: Payer: Medicaid Other

## 2019-04-19 ENCOUNTER — Other Ambulatory Visit: Payer: Self-pay

## 2019-04-19 DIAGNOSIS — Z3682 Encounter for antenatal screening for nuchal translucency: Secondary | ICD-10-CM

## 2019-04-19 DIAGNOSIS — Z3481 Encounter for supervision of other normal pregnancy, first trimester: Secondary | ICD-10-CM | POA: Diagnosis not present

## 2019-04-19 DIAGNOSIS — Z3A12 12 weeks gestation of pregnancy: Secondary | ICD-10-CM

## 2019-04-19 DIAGNOSIS — Z3490 Encounter for supervision of normal pregnancy, unspecified, unspecified trimester: Secondary | ICD-10-CM | POA: Insufficient documentation

## 2019-04-19 DIAGNOSIS — Z1379 Encounter for other screening for genetic and chromosomal anomalies: Secondary | ICD-10-CM

## 2019-04-19 NOTE — Progress Notes (Signed)
[name] 12+3 wks,measurements c/w dates,crl 62.56 mm,fhr 165 bpm,anterior placenta gr 0,normal ovaries bilat,NB present,NT 1.8 mm

## 2019-04-24 ENCOUNTER — Ambulatory Visit (INDEPENDENT_AMBULATORY_CARE_PROVIDER_SITE_OTHER): Payer: Medicaid Other | Admitting: Women's Health

## 2019-04-24 ENCOUNTER — Encounter: Payer: Self-pay | Admitting: Women's Health

## 2019-04-24 ENCOUNTER — Ambulatory Visit: Payer: Medicaid Other | Admitting: *Deleted

## 2019-04-24 ENCOUNTER — Other Ambulatory Visit: Payer: Self-pay

## 2019-04-24 VITALS — BP 116/75 | HR 105 | Wt 206.0 lb

## 2019-04-24 DIAGNOSIS — Z3481 Encounter for supervision of other normal pregnancy, first trimester: Secondary | ICD-10-CM

## 2019-04-24 DIAGNOSIS — Z3A13 13 weeks gestation of pregnancy: Secondary | ICD-10-CM | POA: Diagnosis not present

## 2019-04-24 DIAGNOSIS — Z1389 Encounter for screening for other disorder: Secondary | ICD-10-CM

## 2019-04-24 DIAGNOSIS — Z363 Encounter for antenatal screening for malformations: Secondary | ICD-10-CM

## 2019-04-24 DIAGNOSIS — Z331 Pregnant state, incidental: Secondary | ICD-10-CM

## 2019-04-24 LAB — POCT URINALYSIS DIPSTICK OB
Blood, UA: NEGATIVE
Glucose, UA: NEGATIVE
Nitrite, UA: NEGATIVE

## 2019-04-24 LAB — MATERNIT 21 PLUS CORE, BLOOD
Fetal Fraction: 9
Result (T21): NEGATIVE
Trisomy 13 (Patau syndrome): NEGATIVE
Trisomy 18 (Edwards syndrome): NEGATIVE
Trisomy 21 (Down syndrome): NEGATIVE

## 2019-04-24 LAB — INTEGRATED 1
Crown Rump Length: 62.6 mm
Gest. Age on Collection Date: 12.4 weeks
Maternal Age at EDD: 21 yr
Nuchal Translucency (NT): 1.8 mm
Number of Fetuses: 1
PAPP-A Value: 895.2 ng/mL
Weight: 208 [lb_av]

## 2019-04-24 LAB — OBSTETRIC PANEL, INCLUDING HIV
Antibody Screen: NEGATIVE
Basophils Absolute: 0 10*3/uL (ref 0.0–0.2)
Basos: 0 %
EOS (ABSOLUTE): 0.1 10*3/uL (ref 0.0–0.4)
Eos: 1 %
HIV Screen 4th Generation wRfx: NONREACTIVE
Hematocrit: 42.2 % (ref 34.0–46.6)
Hemoglobin: 14.1 g/dL (ref 11.1–15.9)
Hepatitis B Surface Ag: NEGATIVE
Immature Grans (Abs): 0 10*3/uL (ref 0.0–0.1)
Immature Granulocytes: 0 %
Lymphocytes Absolute: 1.6 10*3/uL (ref 0.7–3.1)
Lymphs: 19 %
MCH: 30.3 pg (ref 26.6–33.0)
MCHC: 33.4 g/dL (ref 31.5–35.7)
MCV: 91 fL (ref 79–97)
Monocytes Absolute: 0.3 10*3/uL (ref 0.1–0.9)
Monocytes: 4 %
Neutrophils Absolute: 6.6 10*3/uL (ref 1.4–7.0)
Neutrophils: 76 %
Platelets: 177 10*3/uL (ref 150–450)
RBC: 4.65 x10E6/uL (ref 3.77–5.28)
RDW: 13.8 % (ref 11.7–15.4)
RPR Ser Ql: NONREACTIVE
Rh Factor: POSITIVE
Rubella Antibodies, IGG: 5.12 index (ref >0.99)
WBC: 8.7 10*3/uL (ref 3.4–10.8)

## 2019-04-24 LAB — SICKLE CELL SCREEN: Sickle Cell Screen: NEGATIVE

## 2019-04-24 MED ORDER — BLOOD PRESSURE MONITOR MISC: 0 refills | Status: DC

## 2019-04-24 MED ORDER — DOXYLAMINE-PYRIDOXINE 10-10 MG PO TBEC: DELAYED_RELEASE_TABLET | ORAL | 6 refills | Status: DC

## 2019-04-24 NOTE — Progress Notes (Addendum)
INITIAL OBSTETRICAL VISIT Patient name: Brittany Kim MRN [id number]  Date of birth: 10/04/2001 Chief Complaint:   Initial Prenatal Visit (constipation)  History of Present Illness:   Brittany Kim is a 18 y.o. 22P1001 Caucasian female at 235w1d by LMP c/w 8wk u/s, with an Estimated Date of Delivery: 10/29/19 being seen today for her initial obstetrical visit.   Her obstetrical history is significant for term uncomplicated SVB x 1.   Today she reports nausea and constipation Patient's last menstrual period was 01/22/2019. Last pap <21yo. Results were: n/a Review of Systems:   Pertinent items are noted in HPI Denies cramping/contractions, leakage of fluid, vaginal bleeding, abnormal vaginal discharge w/ itching/odor/irritation, headaches, visual changes, shortness of breath, chest pain, abdominal pain, severe nausea/vomiting, or problems with urination or bowel movements unless otherwise stated above.  Pertinent History Reviewed:  Reviewed past medical,surgical, social, obstetrical and family history.  Reviewed problem list, medications and allergies. OB History  Gravida Para Term Preterm AB Living  2 1 1  0 0 1  SAB TAB Ectopic Multiple Live Births  0 0 0 0 1    # Outcome Date GA Lbr Len/2nd Weight Sex Delivery Anes PTL Lv  2 Current           1 Term 03/17/18 6669w6d 02:42 / 02:29 7 lb 7.9 oz (3.4 kg) F Vag-Spont EPI  LIV   Physical Assessment:   Vitals:   04/24/19 1126  BP: 116/75  Pulse: (!) 105  Weight: 206 lb (93.4 kg)  Body mass index is 32.26 kg/m.       Physical Examination:  General appearance - well appearing, and in no distress  Mental status - alert, oriented to person, place, and time  Psych:  She has a normal mood and affect  Skin - warm and dry, normal color, no suspicious lesions noted  Chest - effort normal, all lung fields clear to auscultation bilaterally  Heart - normal rate and regular rhythm  Abdomen - soft, nontender  Extremities:  No swelling or  varicosities noted  Thin prep pap is not done  FHT: 161 via doppler  No results found for this or any previous visit (from the past 24 hour(s)).  Assessment & Plan:  1) Low-Risk Pregnancy G2P1001 at 5535w1d with an Estimated Date of Delivery: 10/29/19   2) Initial OB visit  3) Nausea> rx diclegis  4) Constipation> gave printed prevention/relief measures   Meds:  Meds ordered this encounter  Medications  . Blood Pressure Monitor MISC    Sig: For regular home bp monitoring during pregnancy    Dispense:  1 each    Refill:  0    Z34.90  . Doxylamine-Pyridoxine (DICLEGIS) 10-10 MG TBEC    Sig: 2 tabs q hs, if sx persist add 1 tab q am on day 3, if sx persist add 1 tab q afternoon on day 4    Dispense:  100 tablet    Refill:  6    Order Specific Question:   Supervising Provider    Answer:   Lazaro ArmsEURE, LUTHER H [[id number]]    Initial labs obtained Continue prenatal vitamins Reviewed n/v relief measures and warning s/s to report Reviewed recommended weight gain based on pre-gravid BMI Encouraged well-balanced diet Genetic Screening discussed: requested, did 1st IT/NT and maternit21 last week Cystic fibrosis, SMA, Fragile X screening discussed declined Ultrasound discussed; fetal survey: requested CCNC completed>PCM not here, form faxed Does not have home bp cuff. Rx faxed to [facility]. Check  bp weekly, let us know if >140/90.   Follow-up: Return in about 5 weeks (around 05/29/2019) for [place], in person, 2nd IT, [id number].   Orders Placed This Encounter  Procedures  . Urine Culture  . US OB Comp + 14 Wk  . Urinalysis, Routine w reflex microscopic  . Pain Management Screening Profile (10S)  . POC Urinalysis Dipstick OB    Roma Schanz CNM, [facility] 04/24/2019 12:05 PM

## 2019-04-24 NOTE — Patient Instructions (Signed)
Brittany Kim, I greatly value your feedback.  If you receive a survey following your visit with [name] today, we appreciate you taking the time to fill it out.  Thanks, Knute Neu, CNM, [facility]  [place]!!! It is now [place] at [facility] ([place], [place] [address]) Entrance located off of [address] parking   Nausea & Vomiting  Have saltine crackers or pretzels by your bed and eat a few bites before you raise your head out of bed in the morning  Eat small frequent meals throughout the day instead of large meals  Drink plenty of fluids throughout the day to stay hydrated, just don't drink a lot of fluids with your meals.  This can make your stomach fill up faster making you feel sick  Do not brush your teeth right after you eat  Products with real ginger are good for nausea, like ginger ale and ginger hard candy Make sure it says made with real ginger!  Sucking on sour candy like lemon heads is also good for nausea  If your prenatal vitamins make you nauseated, take them at night so you will sleep through the nausea  Sea Bands  If you feel like you need medicine for the nausea & vomiting please let us know  If you are unable to keep any fluids or food down please let us know   Constipation  Drink plenty of fluid, preferably water, throughout the day  Eat foods high in fiber such as fruits, vegetables, and grains  Exercise, such as walking, is a good way to keep your bowels regular  Drink warm fluids, especially warm prune juice, or decaf coffee  Eat a 1/2 cup of real oatmeal (not instant), 1/2 cup applesauce, and 1/2-1 cup warm prune juice every day  If needed, you may take Colace (docusate sodium) stool softener once or twice a day to help keep the stool soft.   If you still are having problems with constipation, you may take Miralax once daily as needed to help keep your bowels regular.   Home  Blood Pressure Monitoring for Patients   Your provider has recommended that you check your blood pressure (BP) at least once a week at home. If you do not have a blood pressure cuff at home, one will be provided for you. Contact your provider if you have not received your monitor within 1 week.   Helpful Tips for Accurate Home Blood Pressure Checks  . Don't smoke, exercise, or drink caffeine 30 minutes before checking your BP . Use the restroom before checking your BP (a full bladder can raise your pressure) . Relax in a comfortable upright chair . Feet on the ground . Left arm resting comfortably on a flat surface at the level of your heart . Legs uncrossed . Back supported . Sit quietly and don't talk . Place the cuff on your bare arm . Adjust snuggly, so that only two fingertips can fit between your skin and the top of the cuff . Check 2 readings separated by at least one minute . Keep a log of your BP readings . For a visual, please reference this diagram: http://ccnc.care/bpdiagram  Provider Name: [facility]     Phone: [phone]  Zone 1: ALL CLEAR  Continue to monitor your symptoms:  . BP reading is less than 140 (top number) or less than 90 (bottom number)  . No right upper stomach  pain . No headaches or seeing spots . No feeling nauseated or throwing up . No swelling in face and hands  Zone 2: CAUTION Call your doctor's office for any of the following:  . BP reading is greater than 140 (top number) or greater than 90 (bottom number)  . Stomach pain under your ribs in the middle or right side . Headaches or seeing spots . Feeling nauseated or throwing up . Swelling in face and hands  Zone 3: EMERGENCY  Seek immediate medical care if you have any of the following:  . BP reading is greater than160 (top number) or greater than 110 (bottom number) . Severe headaches not improving with Tylenol . Serious difficulty catching your breath . Any worsening symptoms  from Zone 2    First Trimester of Pregnancy The first trimester of pregnancy is from week 1 until the end of week 12 (months 1 through 3). A week after a sperm fertilizes an egg, the egg will implant on the wall of the uterus. This embryo will begin to develop into a baby. Genes from you and your partner are forming the baby. The female genes determine whether the baby is a boy or a girl. At 6-8 weeks, the eyes and face are formed, and the heartbeat can be seen on ultrasound. At the end of 12 weeks, all the baby's organs are formed.  Now that you are pregnant, you will want to do everything you can to have a healthy baby. Two of the most important things are to get good prenatal care and to follow your health care provider's instructions. Prenatal care is all the medical care you receive before the baby's birth. This care will help prevent, find, and treat any problems during the pregnancy and childbirth. BODY CHANGES Your body goes through many changes during pregnancy. The changes vary from woman to woman.   You may gain or lose a couple of pounds at first.  You may feel sick to your stomach (nauseous) and throw up (vomit). If the vomiting is uncontrollable, call your health care provider.  You may tire easily.  You may develop headaches that can be relieved by medicines approved by your health care provider.  You may urinate more often. Painful urination may mean you have a bladder infection.  You may develop heartburn as a result of your pregnancy.  You may develop constipation because certain hormones are causing the muscles that push waste through your intestines to slow down.  You may develop hemorrhoids or swollen, bulging veins (varicose veins).  Your breasts may begin to grow larger and become tender. Your nipples may stick out more, and the tissue that surrounds them (areola) may become darker.  Your gums may bleed and may be sensitive to brushing and flossing.  Dark spots or  blotches (chloasma, mask of pregnancy) may develop on your face. This will likely fade after the baby is born.  Your menstrual periods will stop.  You may have a loss of appetite.  You may develop cravings for certain kinds of food.  You may have changes in your emotions from day to day, such as being excited to be pregnant or being concerned that something may go wrong with the pregnancy and baby.  You may have more vivid and strange dreams.  You may have changes in your hair. These can include thickening of your hair, rapid growth, and changes in texture. Some women also have hair loss during or after pregnancy, or hair that  feels dry or thin. Your hair will most likely return to normal after your baby is born. WHAT TO EXPECT AT YOUR PRENATAL VISITS During a routine prenatal visit:  You will be weighed to make sure you and the baby are growing normally.  Your blood pressure will be taken.  Your abdomen will be measured to track your baby's growth.  The fetal heartbeat will be listened to starting around week 10 or 12 of your pregnancy.  Test results from any previous visits will be discussed. Your health care provider may ask you:  How you are feeling.  If you are feeling the baby move.  If you have had any abnormal symptoms, such as leaking fluid, bleeding, severe headaches, or abdominal cramping.  If you have any questions. Other tests that may be performed during your first trimester include:  Blood tests to find your blood type and to check for the presence of any previous infections. They will also be used to check for low iron levels (anemia) and Rh antibodies. Later in the pregnancy, blood tests for diabetes will be done along with other tests if problems develop.  Urine tests to check for infections, diabetes, or protein in the urine.  An ultrasound to confirm the proper growth and development of the baby.  An amniocentesis to check for possible genetic problems.   Fetal screens for spina bifida and Down syndrome.  You may need other tests to make sure you and the baby are doing well. HOME CARE INSTRUCTIONS  Medicines  Follow your health care provider's instructions regarding medicine use. Specific medicines may be either safe or unsafe to take during pregnancy.  Take your prenatal vitamins as directed.  If you develop constipation, try taking a stool softener if your health care provider approves. Diet  Eat regular, well-balanced meals. Choose a variety of foods, such as meat or vegetable-based protein, fish, milk and low-fat dairy products, vegetables, fruits, and whole grain breads and cereals. Your health care provider will help you determine the amount of weight gain that is right for you.  Avoid raw meat and uncooked cheese. These carry germs that can cause birth defects in the baby.  Eating four or five small meals rather than three large meals a day may help relieve nausea and vomiting. If you start to feel nauseous, eating a few soda crackers can be helpful. Drinking liquids between meals instead of during meals also seems to help nausea and vomiting.  If you develop constipation, eat more high-fiber foods, such as fresh vegetables or fruit and whole grains. Drink enough fluids to keep your urine clear or pale yellow. Activity and Exercise  Exercise only as directed by your health care provider. Exercising will help you:  Control your weight.  Stay in shape.  Be prepared for labor and delivery.  Experiencing pain or cramping in the lower abdomen or low back is a good sign that you should stop exercising. Check with your health care provider before continuing normal exercises.  Try to avoid standing for long periods of time. Move your legs often if you must stand in one place for a long time.  Avoid heavy lifting.  Wear low-heeled shoes, and practice good posture.  You may continue to have sex unless your health care provider  directs you otherwise. Relief of Pain or Discomfort  Wear a good support bra for breast tenderness.    Take warm sitz baths to soothe any pain or discomfort caused by hemorrhoids. Use hemorrhoid cream  if your health care provider approves.    Rest with your legs elevated if you have leg cramps or low back pain.  If you develop varicose veins in your legs, wear support hose. Elevate your feet for 15 minutes, 3-4 times a day. Limit salt in your diet. Prenatal Care  Schedule your prenatal visits by the twelfth week of pregnancy. They are usually scheduled monthly at first, then more often in the last 2 months before delivery.  Write down your questions. Take them to your prenatal visits.  Keep all your prenatal visits as directed by your health care provider. Safety  Wear your seat belt at all times when driving.  Make a list of emergency phone numbers, including numbers for family, friends, the hospital, and police and fire departments. General Tips  Ask your health care provider for a referral to a local prenatal education class. Begin classes no later than at the beginning of month 6 of your pregnancy.  Ask for help if you have counseling or nutritional needs during pregnancy. Your health care provider can offer advice or refer you to specialists for help with various needs.  Do not use hot tubs, steam rooms, or saunas.  Do not douche or use tampons or scented sanitary pads.  Do not cross your legs for long periods of time.  Avoid cat litter boxes and soil used by cats. These carry germs that can cause birth defects in the baby and possibly loss of the fetus by miscarriage or stillbirth.  Avoid all smoking, herbs, alcohol, and medicines not prescribed by your health care provider. Chemicals in these affect the formation and growth of the baby.  Schedule a dentist appointment. At home, brush your teeth with a soft toothbrush and be gentle when you floss. SEEK MEDICAL CARE IF:    You have dizziness.  You have mild pelvic cramps, pelvic pressure, or nagging pain in the abdominal area.  You have persistent nausea, vomiting, or diarrhea.  You have a bad smelling vaginal discharge.  You have pain with urination.  You notice increased swelling in your face, hands, legs, or ankles. SEEK IMMEDIATE MEDICAL CARE IF:   You have a fever.  You are leaking fluid from your vagina.  You have spotting or bleeding from your vagina.  You have severe abdominal cramping or pain.  You have rapid weight gain or loss.  You vomit blood or material that looks like coffee grounds.  You are exposed to [name] measles and have never had them.  You are exposed to fifth disease or chickenpox.  You develop a severe headache.  You have shortness of breath.  You have any kind of trauma, such as from a fall or a car accident. Document Released: 09/15/2001 Document Revised: 02/05/2014 Document Reviewed: 08/01/2013 [employer] Patient Information 2015 [place], [place]. This information is not intended to replace advice given to you by your health care provider. Make sure you discuss any questions you have with your health care provider.  Coronavirus (COVID-19) Are you at risk?  Are you at risk for the Coronavirus (COVID-19)?  To be considered HIGH RISK for Coronavirus (COVID-19), you have to meet the following criteria:  . Traveled to [place], [place], [place], [place] or [place]; or in the [place] to [place], [place], [place], or [place]; and have fever, cough, and shortness of breath within the last 2 weeks of travel OR . Been in close contact with a person diagnosed with COVID-19 within the last 2  weeks and have fever, cough, and shortness of breath . IF YOU DO NOT MEET THESE CRITERIA, YOU ARE CONSIDERED LOW RISK FOR COVID-19.  What to do if you are HIGH RISK for COVID-19?  Marland Kitchen If you are having a medical emergency, call 911. . Seek medical care right away. Before  you go to a doctor's office, urgent care or emergency department, call ahead and tell them about your recent travel, contact with someone diagnosed with COVID-19, and your symptoms. You should receive instructions from your physician's office regarding next steps of care.  . When you arrive at healthcare provider, tell the healthcare staff immediately you have returned from visiting [place], [place], [place], [place] or [place]; or traveled in the [place] to [place], [place], [place], or [place]; in the last two weeks or you have been in close contact with a person diagnosed with COVID-19 in the last 2 weeks.   . Tell the health care staff about your symptoms: fever, cough and shortness of breath. . After you have been seen by a medical provider, you will be either: o Tested for (COVID-19) and discharged home on quarantine except to seek medical care if symptoms worsen, and asked to  - Stay home and avoid contact with others until you get your results (4-5 days)  - Avoid travel on public transportation if possible (such as bus, train, or airplane) or o Sent to the Emergency Department by EMS for evaluation, COVID-19 testing, and possible admission depending on your condition and test results.  What to do if you are LOW RISK for COVID-19?  Reduce your risk of any infection by using the same precautions used for avoiding the common cold or flu:  Marland Kitchen Wash your hands often with soap and warm water for at least 20 seconds.  If soap and water are not readily available, use an alcohol-based hand sanitizer with at least 60% alcohol.  . If coughing or sneezing, cover your mouth and nose by coughing or sneezing into the elbow areas of your shirt or coat, into a tissue or into your sleeve (not your hands). . Avoid shaking hands with others and consider head nods or verbal greetings only. . Avoid touching your eyes, nose, or mouth with unwashed hands.  . Avoid close contact with people who are sick. .  Avoid places or events with large numbers of people in one location, like concerts or sporting events. . Carefully consider travel plans you have or are making. . If you are planning any travel outside or inside the [name], visit the CDC's Travelers' Health webpage for the latest health notices. . If you have some symptoms but not all symptoms, continue to monitor at home and seek medical attention if your symptoms worsen. . If you are having a medical emergency, call 911.   [place] / e-Visit: [email or web address]         [facility]: [facility]: [id number]                   [facility]: [phone]

## 2019-04-26 LAB — URINE CULTURE

## 2019-05-15 ENCOUNTER — Telehealth: Payer: Self-pay | Admitting: Women's Health

## 2019-05-15 NOTE — Telephone Encounter (Signed)
Patient called, stated that she's 16 weeks and a day and feels like her vagina is swollen and a weird discharge.  [phone]

## 2019-05-15 NOTE — Telephone Encounter (Signed)
Patient states she is having some vaginal discharge along with itching.  Has recently had intercourse and vaginal is now swollen.  Informed patient the swelling could be from intercourse and could try putting a cool pack up next to vagina to see if that would help with the swelling.  Patient stated she wants to be seen to have it evaluated.  Pt placed on schedule.

## 2019-05-16 ENCOUNTER — Encounter: Payer: Self-pay | Admitting: Women's Health

## 2019-05-16 ENCOUNTER — Ambulatory Visit (INDEPENDENT_AMBULATORY_CARE_PROVIDER_SITE_OTHER): Payer: Medicaid Other | Admitting: Women's Health

## 2019-05-16 ENCOUNTER — Other Ambulatory Visit: Payer: Self-pay

## 2019-05-16 VITALS — BP 101/68 | HR 114 | Wt 207.5 lb

## 2019-05-16 DIAGNOSIS — N898 Other specified noninflammatory disorders of vagina: Secondary | ICD-10-CM

## 2019-05-16 DIAGNOSIS — O26892 Other specified pregnancy related conditions, second trimester: Secondary | ICD-10-CM

## 2019-05-16 DIAGNOSIS — Z3481 Encounter for supervision of other normal pregnancy, first trimester: Secondary | ICD-10-CM | POA: Diagnosis not present

## 2019-05-16 DIAGNOSIS — Z3A16 16 weeks gestation of pregnancy: Secondary | ICD-10-CM

## 2019-05-16 DIAGNOSIS — Z3A13 13 weeks gestation of pregnancy: Secondary | ICD-10-CM | POA: Diagnosis not present

## 2019-05-16 DIAGNOSIS — Z331 Pregnant state, incidental: Secondary | ICD-10-CM

## 2019-05-16 DIAGNOSIS — Z1389 Encounter for screening for other disorder: Secondary | ICD-10-CM

## 2019-05-16 DIAGNOSIS — Z3482 Encounter for supervision of other normal pregnancy, second trimester: Secondary | ICD-10-CM

## 2019-05-16 LAB — POCT URINALYSIS DIPSTICK OB
Blood, UA: NEGATIVE
Glucose, UA: NEGATIVE
Ketones, UA: NEGATIVE
Nitrite, UA: NEGATIVE
POC,PROTEIN,UA: NEGATIVE

## 2019-05-16 NOTE — Progress Notes (Signed)
White discharge with odor.

## 2019-05-16 NOTE — Progress Notes (Signed)
   Work-in LOW-RISK PREGNANCY VISIT Patient name: Brittany Kim MRN [id number]  Date of birth: 01/30/2001 Chief Complaint:   work in ob ( vaginal swelling and discharge)  History of Present Illness:   Brittany Kim is a 18 y.o. G69P1001 female at [redacted]w[redacted]d with an Estimated Date of Delivery: 10/29/19 being seen today for ongoing management of a low-risk pregnancy.  Today she is being seen as a work-in for report of odorous vaginal d/c w/ vulvar swelling since the weekend. No itching/irritation.  Contractions: Not present. Vag. Bleeding: None.  Movement: Present. denies leaking of fluid. Review of Systems:   Pertinent items are noted in HPI Denies abnormal vaginal discharge w/ itching/odor/irritation, headaches, visual changes, shortness of breath, chest pain, abdominal pain, severe nausea/vomiting, or problems with urination or bowel movements unless otherwise stated above. Pertinent History Reviewed:  Reviewed past medical,surgical, social, obstetrical and family history.  Reviewed problem list, medications and allergies. Physical Assessment:   Vitals:   05/16/19 1521  BP: 101/68  Pulse: (!) 114  Weight: 207 lb 8 oz (94.1 kg)  Body mass index is 32.5 kg/m.        Physical Examination:   General appearance: Well appearing, and in no distress  Mental status: Alert, oriented to person, place, and time  Skin: Warm & dry  Cardiovascular: Normal heart rate noted  Respiratory: Normal respiratory effort, no distress  Abdomen: Soft, gravid, nontender  Pelvic: spec exam: cx visually closed, sm-mod amt white d/c         Extremities: Edema: None  Fetal Status: Fetal Heart Rate (bpm): 154   Movement: Present    Results for orders placed or performed in visit on 05/16/19 (from the past 24 hour(s))  POC Urinalysis Dipstick OB   Collection Time: 05/16/19  3:24 PM  Result Value Ref Range   Color, UA     Clarity, UA     Glucose, UA Negative Negative   Bilirubin, UA     Ketones, UA neg    Spec Grav, UA     Blood, UA neg    pH, UA     POC,PROTEIN,UA Negative Negative, Trace, Small (1+), Moderate (2+), Large (3+), 4+   Urobilinogen, UA     Nitrite, UA neg    Leukocytes, UA Moderate (2+) (A) Negative   Appearance     Odor      Assessment & Plan:  1) Low-risk pregnancy G2P1001 at [redacted]w[redacted]d with an Estimated Date of Delivery: 10/29/19   2) Vaginal d/c, nuswab plus sent   Meds: No orders of the defined types were placed in this encounter.  Labs/procedures today: spec exam, nuswab  Plan:  Continue routine obstetrical care   Reviewed: Preterm labor symptoms and general obstetric precautions including but not limited to vaginal bleeding, contractions, leaking of fluid and fetal movement were reviewed in detail with the patient.  All questions were answered.   Follow-up: Return for As scheduled 8/24 for anatomy u/s and visit.  Orders Placed This Encounter  Procedures  . NuSwab Vaginitis Plus (VG+)  . POC Urinalysis Dipstick OB   Roma Schanz CNM, [facility] 05/16/2019 3:56 PM

## 2019-05-16 NOTE — Patient Instructions (Signed)
Brittany Kim, I greatly value your feedback.  If you receive a survey following your visit with [name] today, we appreciate you taking the time to fill it out.  Thanks, Knute Neu, CNM, [facility]  [place]!!! It is now [place] at [facility] ([place],  [address]) Entrance located off of [address] parking   Go to [email or web address] to register for FREE online childbirth classes  [place] Pediatricians/Family Doctors:  [facility] [phone]                 [place] [phone] (usually not accepting new patients unless you have family there already, you are always welcome to call and ask)       [facility] Department [phone]       [facility] Pediatricians/Family Doctors:   [facility]: [phone]  [facility]: [phone]  [facility]: [place] Doctors:   [facility]: [facility]: [place]:  [place]: [phone]    Home Blood Pressure Monitoring for Patients   Your provider has recommended that you check your blood pressure (BP) at least once a week at home. If you do not have a blood pressure cuff at home, one will be provided for you. Contact your provider if you have not received your monitor within 1 week.   Helpful Tips for Accurate Home Blood Pressure Checks  . Don't smoke, exercise, or drink caffeine 30 minutes before checking your BP . Use the restroom before checking your BP (a full bladder can raise your pressure) . Relax in a comfortable upright chair . Feet on the ground . Left arm resting comfortably on a flat surface at the level of your heart . Legs uncrossed . Back supported . Sit quietly and don't talk . Place the  cuff on your bare arm . Adjust snuggly, so that only two fingertips can fit between your skin and the top of the cuff . Check 2 readings separated by at least one minute . Keep a log of your BP readings . For a visual, please reference this diagram: http://ccnc.care/bpdiagram  Provider Name: [facility]     Phone: [phone]  Zone 1: ALL CLEAR  Continue to monitor your symptoms:  . BP reading is less than 140 (top number) or less than 90 (bottom number)  . No right upper stomach pain . No headaches or seeing spots . No feeling nauseated or throwing up . No swelling in face and hands  Zone 2: CAUTION Call your doctor's office for any of the following:  . BP reading is greater than 140 (top number) or greater than 90 (bottom number)  . Stomach pain under your ribs in the middle or right side . Headaches or seeing spots . Feeling nauseated or throwing up . Swelling in face and hands  Zone 3: EMERGENCY  Seek immediate medical care if you have any of the following:  . BP reading is greater than160 (top number) or greater than 110 (bottom number) . Severe headaches not improving with Tylenol . Serious difficulty catching your breath . Any worsening symptoms from Zone 2     Second Trimester of Pregnancy The second trimester is from week 14 through week 27 (months 4 through 6). The second trimester is  often a time when you feel your best. Your body has adjusted to being pregnant, and you begin to feel better physically. Usually, morning sickness has lessened or quit completely, you may have more energy, and you may have an increase in appetite. The second trimester is also a time when the fetus is growing rapidly. At the end of the sixth month, the fetus is about 9 inches long and weighs about 1 pounds. You will likely begin to feel the baby move (quickening) between 16 and 20 weeks of pregnancy. Body changes during your second trimester Your body continues to go through many  changes during your second trimester. The changes vary from woman to woman.  Your weight will continue to increase. You will notice your lower abdomen bulging out.  You may begin to get stretch marks on your hips, abdomen, and breasts.  You may develop headaches that can be relieved by medicines. The medicines should be approved by your health care provider.  You may urinate more often because the fetus is pressing on your bladder.  You may develop or continue to have heartburn as a result of your pregnancy.  You may develop constipation because certain hormones are causing the muscles that push waste through your intestines to slow down.  You may develop hemorrhoids or swollen, bulging veins (varicose veins).  You may have back pain. This is caused by: ? Weight gain. ? Pregnancy hormones that are relaxing the joints in your pelvis. ? A shift in weight and the muscles that support your balance.  Your breasts will continue to grow and they will continue to become tender.  Your gums may bleed and may be sensitive to brushing and flossing.  Dark spots or blotches (chloasma, mask of pregnancy) may develop on your face. This will likely fade after the baby is born.  A dark line from your belly button to the pubic area (linea nigra) may appear. This will likely fade after the baby is born.  You may have changes in your hair. These can include thickening of your hair, rapid growth, and changes in texture. Some women also have hair loss during or after pregnancy, or hair that feels dry or thin. Your hair will most likely return to normal after your baby is born.  What to expect at prenatal visits During a routine prenatal visit:  You will be weighed to make sure you and the fetus are growing normally.  Your blood pressure will be taken.  Your abdomen will be measured to track your baby's growth.  The fetal heartbeat will be listened to.  Any test results from the previous visit will  be discussed.  Your health care provider may ask you:  How you are feeling.  If you are feeling the baby move.  If you have had any abnormal symptoms, such as leaking fluid, bleeding, severe headaches, or abdominal cramping.  If you are using any tobacco products, including cigarettes, chewing tobacco, and electronic cigarettes.  If you have any questions.  Other tests that may be performed during your second trimester include:  Blood tests that check for: ? Low iron levels (anemia). ? High blood sugar that affects pregnant women (gestational diabetes) between 74 and 28 weeks. ? Rh antibodies. This is to check for a protein on red blood cells (Rh factor).  Urine tests to check for infections, diabetes, or protein in the urine.  An ultrasound to confirm the proper growth and development of the baby.  An amniocentesis to  check for possible genetic problems.  Fetal screens for spina bifida and Down syndrome.  HIV (human immunodeficiency virus) testing. Routine prenatal testing includes screening for HIV, unless you choose not to have this test.  Follow these instructions at home: Medicines  Follow your health care provider's instructions regarding medicine use. Specific medicines may be either safe or unsafe to take during pregnancy.  Take a prenatal vitamin that contains at least 600 micrograms (mcg) of folic acid.  If you develop constipation, try taking a stool softener if your health care provider approves. Eating and drinking  Eat a balanced diet that includes fresh fruits and vegetables, whole grains, good sources of protein such as meat, eggs, or tofu, and low-fat dairy. Your health care provider will help you determine the amount of weight gain that is right for you.  Avoid raw meat and uncooked cheese. These carry germs that can cause birth defects in the baby.  If you have low calcium intake from food, talk to your health care provider about whether you should take  a daily calcium supplement.  Limit foods that are high in fat and processed sugars, such as fried and sweet foods.  To prevent constipation: ? Drink enough fluid to keep your urine clear or pale yellow. ? Eat foods that are high in fiber, such as fresh fruits and vegetables, whole grains, and beans. Activity  Exercise only as directed by your health care provider. Most women can continue their usual exercise routine during pregnancy. Try to exercise for 30 minutes at least 5 days a week. Stop exercising if you experience uterine contractions.  Avoid heavy lifting, wear low heel shoes, and practice good posture.  A sexual relationship may be continued unless your health care provider directs you otherwise. Relieving pain and discomfort  Wear a good support bra to prevent discomfort from breast tenderness.  Take warm sitz baths to soothe any pain or discomfort caused by hemorrhoids. Use hemorrhoid cream if your health care provider approves.  Rest with your legs elevated if you have leg cramps or low back pain.  If you develop varicose veins, wear support hose. Elevate your feet for 15 minutes, 3-4 times a day. Limit salt in your diet. Prenatal Care  Write down your questions. Take them to your prenatal visits.  Keep all your prenatal visits as told by your health care provider. This is important. Safety  Wear your seat belt at all times when driving.  Make a list of emergency phone numbers, including numbers for family, friends, the hospital, and police and fire departments. General instructions  Ask your health care provider for a referral to a local prenatal education class. Begin classes no later than the beginning of month 6 of your pregnancy.  Ask for help if you have counseling or nutritional needs during pregnancy. Your health care provider can offer advice or refer you to specialists for help with various needs.  Do not use hot tubs, steam rooms, or saunas.  Do not  douche or use tampons or scented sanitary pads.  Do not cross your legs for long periods of time.  Avoid cat litter boxes and soil used by cats. These carry germs that can cause birth defects in the baby and possibly loss of the fetus by miscarriage or stillbirth.  Avoid all smoking, herbs, alcohol, and unprescribed drugs. Chemicals in these products can affect the formation and growth of the baby.  Do not use any products that contain nicotine or tobacco, such as  cigarettes and e-cigarettes. If you need help quitting, ask your health care provider.  Visit your dentist if you have not gone yet during your pregnancy. Use a soft toothbrush to brush your teeth and be gentle when you floss. Contact a health care provider if:  You have dizziness.  You have mild pelvic cramps, pelvic pressure, or nagging pain in the abdominal area.  You have persistent nausea, vomiting, or diarrhea.  You have a bad smelling vaginal discharge.  You have pain when you urinate. Get help right away if:  You have a fever.  You are leaking fluid from your vagina.  You have spotting or bleeding from your vagina.  You have severe abdominal cramping or pain.  You have rapid weight gain or weight loss.  You have shortness of breath with chest pain.  You notice sudden or extreme swelling of your face, hands, ankles, feet, or legs.  You have not felt your baby move in over an hour.  You have severe headaches that do not go away when you take medicine.  You have vision changes. Summary  The second trimester is from week 14 through week 27 (months 4 through 6). It is also a time when the fetus is growing rapidly.  Your body goes through many changes during pregnancy. The changes vary from woman to woman.  Avoid all smoking, herbs, alcohol, and unprescribed drugs. These chemicals affect the formation and growth your baby.  Do not use any tobacco products, such as cigarettes, chewing tobacco, and  e-cigarettes. If you need help quitting, ask your health care provider.  Contact your health care provider if you have any questions. Keep all prenatal visits as told by your health care provider. This is important. This information is not intended to replace advice given to you by your health care provider. Make sure you discuss any questions you have with your health care provider. Document Released: 09/15/2001 Document Revised: 02/27/2016 Document Reviewed: 11/22/2012 Elsevier Interactive Patient Education  2017 [employer].

## 2019-05-18 LAB — PMP SCREEN PROFILE (10S), URINE
Amphetamine Scrn, Ur: NEGATIVE ng/mL
BARBITURATE SCREEN URINE: NEGATIVE ng/mL
BENZODIAZEPINE SCREEN, URINE: NEGATIVE ng/mL
CANNABINOIDS UR QL SCN: NEGATIVE ng/mL
Cocaine (Metab) Scrn, Ur: NEGATIVE ng/mL
Creatinine(Crt), U: 135.5 mg/dL (ref 20.0–300.0)
Methadone Screen, Urine: NEGATIVE ng/mL
OXYCODONE+OXYMORPHONE UR QL SCN: NEGATIVE ng/mL
Opiate Scrn, Ur: NEGATIVE ng/mL
Ph of Urine: 6.1 (ref 4.5–8.9)
Phencyclidine Qn, Ur: NEGATIVE ng/mL
Propoxyphene Scrn, Ur: NEGATIVE ng/mL

## 2019-05-19 LAB — NUSWAB VAGINITIS PLUS (VG+)
Candida albicans, NAA: POSITIVE — AB
Candida glabrata, NAA: NEGATIVE
Chlamydia trachomatis, NAA: NEGATIVE
Neisseria gonorrhoeae, NAA: NEGATIVE
Trich vag by NAA: NEGATIVE

## 2019-05-22 ENCOUNTER — Telehealth: Payer: Self-pay | Admitting: Women's Health

## 2019-05-22 DIAGNOSIS — Z3A17 17 weeks gestation of pregnancy: Secondary | ICD-10-CM

## 2019-05-22 DIAGNOSIS — F418 Other specified anxiety disorders: Secondary | ICD-10-CM
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
## 2019-05-29 ENCOUNTER — Ambulatory Visit (INDEPENDENT_AMBULATORY_CARE_PROVIDER_SITE_OTHER): Payer: Medicaid Other | Admitting: Women's Health

## 2019-05-29 ENCOUNTER — Ambulatory Visit (INDEPENDENT_AMBULATORY_CARE_PROVIDER_SITE_OTHER): Payer: Medicaid Other

## 2019-05-29 ENCOUNTER — Encounter: Payer: Self-pay | Admitting: Women's Health

## 2019-05-29 ENCOUNTER — Other Ambulatory Visit: Payer: Self-pay

## 2019-05-29 ENCOUNTER — Ambulatory Visit: Payer: Medicaid Other | Admitting: Women's Health

## 2019-05-29 VITALS — BP 104/71 | HR 102 | Wt 207.5 lb

## 2019-05-29 DIAGNOSIS — Z3482 Encounter for supervision of other normal pregnancy, second trimester: Secondary | ICD-10-CM

## 2019-05-29 DIAGNOSIS — Z363 Encounter for antenatal screening for malformations: Secondary | ICD-10-CM

## 2019-05-29 DIAGNOSIS — Z3A18 18 weeks gestation of pregnancy: Secondary | ICD-10-CM

## 2019-05-29 DIAGNOSIS — Z3481 Encounter for supervision of other normal pregnancy, first trimester: Secondary | ICD-10-CM

## 2019-05-29 DIAGNOSIS — Z1379 Encounter for other screening for genetic and chromosomal anomalies: Secondary | ICD-10-CM | POA: Diagnosis not present

## 2019-05-29 DIAGNOSIS — F418 Other specified anxiety disorders: Secondary | ICD-10-CM | POA: Insufficient documentation

## 2019-05-29 LAB — POCT URINALYSIS DIPSTICK OB
Blood, UA: NEGATIVE
Glucose, UA: NEGATIVE
Ketones, UA: NEGATIVE
Leukocytes, UA: NEGATIVE
Nitrite, UA: NEGATIVE
POC,PROTEIN,UA: NEGATIVE

## 2019-05-29 NOTE — Progress Notes (Signed)
[name] 18+1 wks,breech,cx 4 cm,anterior placenta gr 0,normal ovaries bilat,svp of fluid 4.5 cm,fhr 152 bpm,efw 242 g,anatomy complete,no obvious abnormalities
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
## 2019-05-29 NOTE — Patient Instructions (Signed)
Brittany Kim, I greatly value your feedback.  If you receive a survey following your visit with us today, we appreciate you taking the time to fill it out.  Thanks, Kim Esha Fincher, CNM, WHNP-BC  [facility]!!! It is now [facility] ([address] [place], [place] [address]) Entrance located off of [address] Free 24/7 valet parking   Go to [email or web address] to register for FREE online childbirth classes  [place] Pediatricians/Family Doctors:  [facility] [phone]            [facility] [phone]                 [facility] [phone] (usually not accepting new patients unless you have family there already, you are always welcome to call and ask)       [facility] [phone]       [place] Pediatricians/Family Doctors:   [facility]: [phone]  [facility]: [phone]  [facility] [place]: [phone]  [place] Family Doctors:   [facility]: [phone]   [facility]: [phone]  [place] Family Doctors:  [facility]: [phone]    Home Blood Pressure Monitoring for Patients   Your provider has recommended that you check your blood pressure (BP) at least once a week at home. If you do not have a blood pressure cuff at home, one will be provided for you. Contact your provider if you have not received your monitor within 1 week.   Helpful Tips for Accurate Home Blood Pressure Checks  . Don't smoke, exercise, or drink caffeine 30 minutes before checking your BP . Use the restroom before checking your BP (a full bladder can raise your pressure) . Relax in a comfortable upright chair . Feet on the ground . Left arm resting comfortably on a flat surface at the level of your heart . Legs uncrossed . Back supported . Sit quietly and don't talk . Place the  cuff on your bare arm . Adjust snuggly, so that only two fingertips can fit between your skin and the top of the cuff . Check 2 readings separated by at least one minute . Keep a log of your BP readings . For a visual, please reference this diagram: http://ccnc.care/bpdiagram  Provider Name: [facility]     Phone: [phone]  Zone 1: ALL CLEAR  Continue to monitor your symptoms:  . BP reading is less than 140 (top number) or less than 90 (bottom number)  . No right upper stomach pain . No headaches or seeing spots . No feeling nauseated or throwing up . No swelling in face and hands  Zone 2: CAUTION Call your doctor's office for any of the following:  . BP reading is greater than 140 (top number) or greater than 90 (bottom number)  . Stomach pain under your ribs in the middle or right side . Headaches or seeing spots . Feeling nauseated or throwing up . Swelling in face and hands  Zone 3: EMERGENCY  Seek immediate medical care if you have any of the following:  . BP reading is greater than160 (top number) or greater than 110 (bottom number) . Severe headaches not improving with Tylenol . Serious difficulty catching your breath . Any worsening symptoms from Zone 2     Second Trimester of Pregnancy The second trimester is from week 14 through week 27 (months 4 through 6). The second trimester is   often a time when you feel your best. Your body has adjusted to being pregnant, and you begin to feel better physically. Usually, morning sickness has lessened or quit completely, you may have more energy, and you may have an increase in appetite. The second trimester is also a time when the fetus is growing rapidly. At the end of the sixth month, the fetus is about 9 inches long and weighs about 1 pounds. You will likely begin to feel the baby move (quickening) between 16 and 20 weeks of pregnancy. Body changes during your second trimester Your body continues to go through many  changes during your second trimester. The changes vary from woman to woman.  Your weight will continue to increase. You will notice your lower abdomen bulging out.  You may begin to get stretch marks on your hips, abdomen, and breasts.  You may develop headaches that can be relieved by medicines. The medicines should be approved by your health care provider.  You may urinate more often because the fetus is pressing on your bladder.  You may develop or continue to have heartburn as a result of your pregnancy.  You may develop constipation because certain hormones are causing the muscles that push waste through your intestines to slow down.  You may develop hemorrhoids or swollen, bulging veins (varicose veins).  You may have back pain. This is caused by: ? Weight gain. ? Pregnancy hormones that are relaxing the joints in your pelvis. ? A shift in weight and the muscles that support your balance.  Your breasts will continue to grow and they will continue to become tender.  Your gums may bleed and may be sensitive to brushing and flossing.  Dark spots or blotches (chloasma, mask of pregnancy) may develop on your face. This will likely fade after the baby is born.  A dark line from your belly button to the pubic area (linea nigra) may appear. This will likely fade after the baby is born.  You may have changes in your hair. These can include thickening of your hair, rapid growth, and changes in texture. Some women also have hair loss during or after pregnancy, or hair that feels dry or thin. Your hair will most likely return to normal after your baby is born.  What to expect at prenatal visits During a routine prenatal visit:  You will be weighed to make sure you and the fetus are growing normally.  Your blood pressure will be taken.  Your abdomen will be measured to track your baby's growth.  The fetal heartbeat will be listened to.  Any test results from the previous visit will  be discussed.  Your health care provider may ask you:  How you are feeling.  If you are feeling the baby move.  If you have had any abnormal symptoms, such as leaking fluid, bleeding, severe headaches, or abdominal cramping.  If you are using any tobacco products, including cigarettes, chewing tobacco, and electronic cigarettes.  If you have any questions.  Other tests that may be performed during your second trimester include:  Blood tests that check for: ? Low iron levels (anemia). ? High blood sugar that affects pregnant women (gestational diabetes) between 74 and 28 weeks. ? Rh antibodies. This is to check for a protein on red blood cells (Rh factor).  Urine tests to check for infections, diabetes, or protein in the urine.  An ultrasound to confirm the proper growth and development of the baby.  An amniocentesis to  check for possible genetic problems.  Fetal screens for spina bifida and Down syndrome.  HIV (human immunodeficiency virus) testing. Routine prenatal testing includes screening for HIV, unless you choose not to have this test.  Follow these instructions at home: Medicines  Follow your health care provider's instructions regarding medicine use. Specific medicines may be either safe or unsafe to take during pregnancy.  Take a prenatal vitamin that contains at least 600 micrograms (mcg) of folic acid.  If you develop constipation, try taking a stool softener if your health care provider approves. Eating and drinking  Eat a balanced diet that includes fresh fruits and vegetables, whole grains, good sources of protein such as meat, eggs, or tofu, and low-fat dairy. Your health care provider will help you determine the amount of weight gain that is right for you.  Avoid raw meat and uncooked cheese. These carry germs that can cause birth defects in the baby.  If you have low calcium intake from food, talk to your health care provider about whether you should take  a daily calcium supplement.  Limit foods that are high in fat and processed sugars, such as fried and sweet foods.  To prevent constipation: ? Drink enough fluid to keep your urine clear or pale yellow. ? Eat foods that are high in fiber, such as fresh fruits and vegetables, whole grains, and beans. Activity  Exercise only as directed by your health care provider. Most women can continue their usual exercise routine during pregnancy. Try to exercise for 30 minutes at least 5 days a week. Stop exercising if you experience uterine contractions.  Avoid heavy lifting, wear low heel shoes, and practice good posture.  A sexual relationship may be continued unless your health care provider directs you otherwise. Relieving pain and discomfort  Wear a good support bra to prevent discomfort from breast tenderness.  Take warm sitz baths to soothe any pain or discomfort caused by hemorrhoids. Use hemorrhoid cream if your health care provider approves.  Rest with your legs elevated if you have leg cramps or low back pain.  If you develop varicose veins, wear support hose. Elevate your feet for 15 minutes, 3-4 times a day. Limit salt in your diet. Prenatal Care  Write down your questions. Take them to your prenatal visits.  Keep all your prenatal visits as told by your health care provider. This is important. Safety  Wear your seat belt at all times when driving.  Make a list of emergency phone numbers, including numbers for family, friends, the hospital, and police and fire departments. General instructions  Ask your health care provider for a referral to a local prenatal education class. Begin classes no later than the beginning of month 6 of your pregnancy.  Ask for help if you have counseling or nutritional needs during pregnancy. Your health care provider can offer advice or refer you to specialists for help with various needs.  Do not use hot tubs, steam rooms, or saunas.  Do not  douche or use tampons or scented sanitary pads.  Do not cross your legs for long periods of time.  Avoid cat litter boxes and soil used by cats. These carry germs that can cause birth defects in the baby and possibly loss of the fetus by miscarriage or stillbirth.  Avoid all smoking, herbs, alcohol, and unprescribed drugs. Chemicals in these products can affect the formation and growth of the baby.  Do not use any products that contain nicotine or tobacco, such as  cigarettes and e-cigarettes. If you need help quitting, ask your health care provider.  Visit your dentist if you have not gone yet during your pregnancy. Use a soft toothbrush to brush your teeth and be gentle when you floss. Contact a health care provider if:  You have dizziness.  You have mild pelvic cramps, pelvic pressure, or nagging pain in the abdominal area.  You have persistent nausea, vomiting, or diarrhea.  You have a bad smelling vaginal discharge.  You have pain when you urinate. Get help right away if:  You have a fever.  You are leaking fluid from your vagina.  You have spotting or bleeding from your vagina.  You have severe abdominal cramping or pain.  You have rapid weight gain or weight loss.  You have shortness of breath with chest pain.  You notice sudden or extreme swelling of your face, hands, ankles, feet, or legs.  You have not felt your baby move in over an hour.  You have severe headaches that do not go away when you take medicine.  You have vision changes. Summary  The second trimester is from week 14 through week 27 (months 4 through 6). It is also a time when the fetus is growing rapidly.  Your body goes through many changes during pregnancy. The changes vary from woman to woman.  Avoid all smoking, herbs, alcohol, and unprescribed drugs. These chemicals affect the formation and growth your baby.  Do not use any tobacco products, such as cigarettes, chewing tobacco, and  e-cigarettes. If you need help quitting, ask your health care provider.  Contact your health care provider if you have any questions. Keep all prenatal visits as told by your health care provider. This is important. This information is not intended to replace advice given to you by your health care provider. Make sure you discuss any questions you have with your health care provider. Document Released: 09/15/2001 Document Revised: 02/27/2016 Document Reviewed: 11/22/2012 Elsevier Interactive Patient Education  2017 [employer].

## 2019-05-31 LAB — INTEGRATED 2
AFP MoM: 2.18
Alpha-Fetoprotein: 69.5 ng/mL
Crown Rump Length: 62.6 mm
DIA MoM: 1.23
DIA Value: 169.5 pg/mL
Estriol, Unconjugated: 1.88 ng/mL
Gest. Age on Collection Date: 12.4 weeks
Gestational Age: 18.1 weeks
Maternal Age at EDD: 21 yr
Nuchal Translucency (NT): 1.8 mm
Nuchal Translucency MoM: 1.29
Number of Fetuses: 1
PAPP-A MoM: 1.29
PAPP-A Value: 895.2 ng/mL
Test Results:: NEGATIVE
Weight: 208 [lb_av]
Weight: 208 [lb_av]
hCG MoM: 1.75
hCG Value: 35.6 IU/mL
uE3 MoM: 1.47

## 2019-06-17 DIAGNOSIS — Z3481 Encounter for supervision of other normal pregnancy, first trimester: Secondary | ICD-10-CM | POA: Diagnosis not present

## 2019-06-26 ENCOUNTER — Encounter: Payer: Self-pay | Admitting: Obstetrics & Gynecology

## 2019-06-26 ENCOUNTER — Other Ambulatory Visit: Payer: Self-pay

## 2019-06-26 ENCOUNTER — Ambulatory Visit (INDEPENDENT_AMBULATORY_CARE_PROVIDER_SITE_OTHER): Payer: Medicaid Other | Admitting: Obstetrics & Gynecology

## 2019-06-26 VITALS — BP 105/73 | HR 103 | Wt 210.0 lb

## 2019-06-26 DIAGNOSIS — Z1389 Encounter for screening for other disorder: Secondary | ICD-10-CM

## 2019-06-26 DIAGNOSIS — Z3482 Encounter for supervision of other normal pregnancy, second trimester: Secondary | ICD-10-CM

## 2019-06-26 DIAGNOSIS — Z3A22 22 weeks gestation of pregnancy: Secondary | ICD-10-CM

## 2019-06-26 DIAGNOSIS — Z331 Pregnant state, incidental: Secondary | ICD-10-CM

## 2019-06-26 LAB — POCT URINALYSIS DIPSTICK OB
Blood, UA: NEGATIVE
Glucose, UA: NEGATIVE
Ketones, UA: NEGATIVE
Leukocytes, UA: NEGATIVE
Nitrite, UA: NEGATIVE
POC,PROTEIN,UA: NEGATIVE

## 2019-06-26 NOTE — Progress Notes (Signed)
   LOW-RISK PREGNANCY VISIT Patient name: Brittany Kim MRN [id number]  Date of birth: October 26, 2000 Chief Complaint:   Routine Prenatal Visit  History of Present Illness:   Brittany Kim is a 18 y.o. G67P1001 female at [redacted]w[redacted]d with an Estimated Date of Delivery: 10/29/19 being seen today for ongoing management of a low-risk pregnancy.  Today she reports no complaints. Contractions: Not present.  .  Movement: Absent. denies leaking of fluid. Review of Systems:   Pertinent items are noted in HPI Denies abnormal vaginal discharge w/ itching/odor/irritation, headaches, visual changes, shortness of breath, chest pain, abdominal pain, severe nausea/vomiting, or problems with urination or bowel movements unless otherwise stated above. Pertinent History Reviewed:  Reviewed past medical,surgical, social, obstetrical and family history.  Reviewed problem list, medications and allergies. Physical Assessment:   Vitals:   06/26/19 1345  BP: 105/73  Pulse: (!) 103  Weight: 210 lb (95.3 kg)  Body mass index is 32.89 kg/m.        Physical Examination:   General appearance: Well appearing, and in no distress  Mental status: Alert, oriented to person, place, and time  Skin: Warm & dry  Cardiovascular: Normal heart rate noted  Respiratory: Normal respiratory effort, no distress  Abdomen: Soft, gravid, nontender  Pelvic: Cervical exam deferred         Extremities: Edema: None  Fetal Status: Fetal Heart Rate (bpm): 162 Fundal Height: 24 cm Movement: Absent    Results for orders placed or performed in visit on 06/26/19 (from the past 24 hour(s))  POC Urinalysis Dipstick OB   Collection Time: 06/26/19  1:47 PM  Result Value Ref Range   Color, UA     Clarity, UA     Glucose, UA Negative Negative   Bilirubin, UA     Ketones, UA neg    Spec Grav, UA     Blood, UA neg    pH, UA     POC,PROTEIN,UA Negative Negative, Trace, Small (1+), Moderate (2+), Large (3+), 4+   Urobilinogen, UA     Nitrite, UA neg    Leukocytes, UA Negative Negative   Appearance     Odor      Assessment & Plan:  1) Low-risk pregnancy G2P1001 at [redacted]w[redacted]d with an Estimated Date of Delivery: 10/29/19      Meds: No orders of the defined types were placed in this encounter.  Labs/procedures today:   Plan:  Continue routine obstetrical care PN2 next visit  Reviewed: Preterm labor symptoms and general obstetric precautions including but not limited to vaginal bleeding, contractions, leaking of fluid and fetal movement were reviewed in detail with the patient.  All questions were answered.  home bp cuff. Rx faxed to . Check bp weekly, let us know if >140/90.   Follow-up: Return in about 5 weeks (around 07/31/2019) for PN2, LROB.  Orders Placed This Encounter  Procedures  . POC Urinalysis Dipstick OB   Florian Buff  06/26/2019 1:55 PM

## 2019-07-31 ENCOUNTER — Other Ambulatory Visit: Payer: Self-pay

## 2019-07-31 ENCOUNTER — Encounter: Payer: Medicaid Other | Admitting: Women's Health

## 2019-07-31 ENCOUNTER — Other Ambulatory Visit: Payer: Medicaid Other

## 2019-07-31 ENCOUNTER — Encounter: Payer: Self-pay | Admitting: Obstetrics & Gynecology

## 2019-07-31 ENCOUNTER — Ambulatory Visit (INDEPENDENT_AMBULATORY_CARE_PROVIDER_SITE_OTHER): Payer: Medicaid Other | Admitting: Obstetrics & Gynecology

## 2019-07-31 VITALS — BP 105/71 | HR 82 | Wt 219.0 lb

## 2019-07-31 DIAGNOSIS — Z3A27 27 weeks gestation of pregnancy: Secondary | ICD-10-CM

## 2019-07-31 DIAGNOSIS — Z3480 Encounter for supervision of other normal pregnancy, unspecified trimester: Secondary | ICD-10-CM | POA: Diagnosis not present

## 2019-07-31 DIAGNOSIS — Z3482 Encounter for supervision of other normal pregnancy, second trimester: Secondary | ICD-10-CM

## 2019-07-31 NOTE — Progress Notes (Signed)
   LOW-RISK PREGNANCY VISIT Patient name: Brittany Kim MRN [id number]  Date of birth: 2000-10-26 Chief Complaint:   Routine Prenatal Visit (PN2)  History of Present Illness:   Brittany Kim is a 18 y.o. G81P1001 female at [redacted]w[redacted]d with an Estimated Date of Delivery: 10/29/19 being seen today for ongoing management of a low-risk pregnancy.  Today she reports no complaints. Contractions: Not present. Vag. Bleeding: None.  Movement: Present. denies leaking of fluid. Review of Systems:   Pertinent items are noted in HPI Denies abnormal vaginal discharge w/ itching/odor/irritation, headaches, visual changes, shortness of breath, chest pain, abdominal pain, severe nausea/vomiting, or problems with urination or bowel movements unless otherwise stated above. Pertinent History Reviewed:  Reviewed past medical,surgical, social, obstetrical and family history.  Reviewed problem list, medications and allergies. Physical Assessment:   Vitals:   07/31/19 1042  BP: 105/71  Pulse: 82  Weight: 219 lb (99.3 kg)  Body mass index is 34.3 kg/m.        Physical Examination:   General appearance: Well appearing, and in no distress  Mental status: Alert, oriented to person, place, and time  Skin: Warm & dry  Cardiovascular: Normal heart rate noted  Respiratory: Normal respiratory effort, no distress  Abdomen: Soft, gravid, nontender  Pelvic: Cervical exam deferred         Extremities: Edema: None  Fetal Status: Fetal Heart Rate (bpm): 154 Fundal Height: 30 cm Movement: Present    Chaperone: n/a    No results found for this or any previous visit (from the past 24 hour(s)).  Assessment & Plan:  1) Low-risk pregnancy G2P1001 at [redacted]w[redacted]d with an Estimated Date of Delivery: 10/29/19   2) ,    Meds: No orders of the defined types were placed in this encounter.  Labs/procedures today:   Plan:  Continue routine obstetrical care  Next visit: prefers online    Reviewed: Term labor symptoms and general  obstetric precautions including but not limited to vaginal bleeding, contractions, leaking of fluid and fetal movement were reviewed in detail with the patient.  All questions were answered. Has home bp cuff. Rx faxed to . Check bp weekly, let us know if >140/90.   Follow-up: Return in about 3 weeks (around 08/21/2019) for [place] visit, LROB.  No orders of the defined types were placed in this encounter.  Florian Buff  07/31/2019 10:52 AM

## 2019-08-01 LAB — CBC
Hematocrit: 36.9 % (ref 34.0–46.6)
Hemoglobin: 12.6 g/dL (ref 11.1–15.9)
MCH: 31.7 pg (ref 26.6–33.0)
MCHC: 34.1 g/dL (ref 31.5–35.7)
MCV: 93 fL (ref 79–97)
Platelets: 164 10*3/uL (ref 150–450)
RBC: 3.97 x10E6/uL (ref 3.77–5.28)
RDW: 13.6 % (ref 11.7–15.4)
WBC: 8.6 10*3/uL (ref 3.4–10.8)

## 2019-08-01 LAB — GLUCOSE TOLERANCE, 2 HOURS W/ 1HR
Glucose, 1 hour: 133 mg/dL (ref 65–179)
Glucose, 2 hour: 65 mg/dL (ref 65–152)
Glucose, Fasting: 74 mg/dL (ref 65–91)

## 2019-08-01 LAB — RPR: RPR Ser Ql: NONREACTIVE

## 2019-08-01 LAB — HIV ANTIBODY (ROUTINE TESTING W REFLEX): HIV Screen 4th Generation wRfx: NONREACTIVE

## 2019-08-01 LAB — ANTIBODY SCREEN: Antibody Screen: NEGATIVE

## 2019-08-18 ENCOUNTER — Telehealth: Payer: Self-pay | Admitting: Advanced Practice Midwife

## 2019-08-18 NOTE — Telephone Encounter (Signed)
Just a reminder that your next appointment is scheduled as a virtual visit and NOT in person.  We will call you around your appointment time to get you checked in.  This visit is through your mychart account, so please make sure you have the app downloaded on your device prior to your appointment. We ask that if we have provided you with a blood pressure monitor that you have it with you during your appointment or have checked your blood pressure on the same day prior to your appointment. In the event that you are unable to check your blood pressure at the time of the visit, you will be asked to reschedule.  If you have any questions, please let us know. Have a great day.     IF YOU DO NOT HAVE A BLOOD PRESSURE CUFF, YOU WILL NEED TO COME TO THE OFFICE FOR THIS APPOINTMENT.

## 2019-08-21 ENCOUNTER — Telehealth (INDEPENDENT_AMBULATORY_CARE_PROVIDER_SITE_OTHER): Payer: Medicaid Other | Admitting: Women's Health

## 2019-08-21 ENCOUNTER — Encounter: Payer: Self-pay | Admitting: Women's Health

## 2019-08-21 ENCOUNTER — Other Ambulatory Visit: Payer: Self-pay

## 2019-08-21 VITALS — BP 122/86 | HR 101

## 2019-08-21 DIAGNOSIS — Z3483 Encounter for supervision of other normal pregnancy, third trimester: Secondary | ICD-10-CM

## 2019-08-21 NOTE — Progress Notes (Signed)
   TELEHEALTH VIRTUAL OBSTETRICS VISIT ENCOUNTER NOTE Patient name: Brittany Kim MRN [id number]  Date of birth: 22-Dec-2000  I connected with patient on 08/21/19 at  2:10 PM EST by MyChart video  and verified that I am speaking with the correct person using two identifiers. Due to COVID-19 recommendations, pt is not currently in our office.    I discussed the limitations, risks, security and privacy concerns of performing an evaluation and management service by telephone and the availability of in person appointments. I also discussed with the patient that there may be a patient responsible charge related to this service. The patient expressed understanding and agreed to proceed.  Chief Complaint:   Routine Prenatal Visit  History of Present Illness:   Brittany Kim is a 18 y.o. G78P1001 female at [redacted]w[redacted]d with an Estimated Date of Delivery: 10/29/19 being evaluated today for ongoing management of a low-risk pregnancy.  Today she reports some pressure, leg cramps. Contractions: Not present. Vag. Bleeding: None.  Movement: Present. denies leaking of fluid. Review of Systems:   Pertinent items are noted in HPI Denies abnormal vaginal discharge w/ itching/odor/irritation, headaches, visual changes, shortness of breath, chest pain, abdominal pain, severe nausea/vomiting, or problems with urination or bowel movements unless otherwise stated above. Pertinent History Reviewed:  Reviewed past medical,surgical, social, obstetrical and family history.  Reviewed problem list, medications and allergies. Physical Assessment:   Vitals:   08/21/19 1400  BP: 122/86  Pulse: (!) 101  There is no height or weight on file to calculate BMI.        Physical Examination:   General:  Alert, oriented and cooperative.   Mental Status: Normal mood and affect perceived. Normal judgment and thought content.  Rest of physical exam deferred due to type of encounter  No results found for this or any previous visit  (from the past 24 hour(s)).  Assessment & Plan:  1) Pregnancy G2P1001 at [redacted]w[redacted]d with an Estimated Date of Delivery: 10/29/19   2) Leg cramps, gave printed prevention/relief measures    Meds: No orders of the defined types were placed in this encounter.   Labs/procedures today: none  Plan:  Continue routine obstetrical care.  Has home bp cuff.  Check bp weekly, let us know if >140/90.  Next visit: prefers in person    Reviewed: Preterm labor symptoms and general obstetric precautions including but not limited to vaginal bleeding, contractions, leaking of fluid and fetal movement were reviewed in detail with the patient. The patient was advised to call back or seek an in-person office evaluation/go to [facility] at [facility] for any urgent or concerning symptoms. All questions were answered. Please refer to After Visit Summary for other counseling recommendations.    I provided 15 minutes of non-face-to-face time during this encounter.  Follow-up: Return in about 2 weeks (around 09/04/2019) for [place], in person, CNM (wants flu & tdap).  No orders of the defined types were placed in this encounter.  [place], [facility] 08/21/2019 2:36 PM

## 2019-08-21 NOTE — Patient Instructions (Addendum)
[place], I greatly value your feedback.  If you receive a survey following your visit with [name] today, we appreciate you taking the time to fill it out.  Thanks, Knute Neu, CNM, [facility]  [place]!!! It is now [place] at [facility] ([place], [place] [address]) Entrance located off of [address] parking    Go to [email or web address] to register for FREE online childbirth classes   Tips to Help Leg Cramps  Increase dietary sources of calcium (milk, yogurt, cheese, leafy greens, seafood, legumes, and fruit) and magnesium (dark leafy greens, nuts, seeds, fish, beans, whole grains, avocados, yogurt, bananas, dried fruit, dark chocolate)  Spoonful of regular yellow mustard every night  Pickle juice  Magnesium supplement: 28mmol in the morning, 40mmol at night (can find in the vitamin aisle)  Dorsiflexion of foot: pointing your toes back towards your knee during the cramp       Call the office [phone]) or go to [facility] if:  You begin to have strong, frequent contractions  Your water breaks.  Sometimes it is a big gush of fluid, sometimes it is just a trickle that keeps getting your panties wet or running down your legs  You have vaginal bleeding.  It is normal to have a small amount of spotting if your cervix was checked.   You don't feel your baby moving like normal.  If you don't, get you something to eat and drink and lay down and focus on feeling your baby move.  You should feel at least 10 movements in 2 hours.  If you don't, you should call the office or go to [facility].    Tdap Vaccine  It is recommended that you get the Tdap vaccine during the third trimester of EACH pregnancy to help protect your baby from getting pertussis (whooping cough)  27-36 weeks is the BEST time to do this so that you can pass the protection on to your baby. During pregnancy is better than after pregnancy, but  if you are unable to get it during pregnancy it will be offered at the hospital.   You can get this vaccine with [name], at the health department, your family doctor, or some local pharmacies  Everyone who will be around your baby should also be up-to-date on their vaccines before the baby comes. Adults (who are not pregnant) only need 1 dose of Tdap during adulthood.   [place] Pediatricians/Family Doctors:  [facility] [phone]                 [place] [phone] (usually not accepting new patients unless you have family there already, you are always welcome to call and ask)       [facility] Department [phone]       [facility] Pediatricians/Family Doctors:   [facility]: [phone]  [facility]: [phone]  [facility]: [place] Doctors:   [facility]: [place]: [place]:  [place]: [phone]   Home Blood Pressure Monitoring for Patients   Your provider has recommended that you check your blood pressure (BP) at least once a week at home. If you do not have a blood pressure cuff at home, one will be provided for you. Contact your provider if you  have not received your monitor within 1 week.   Helpful Tips for Accurate Home Blood Pressure Checks  . Don't smoke, exercise, or drink caffeine 30 minutes before checking your BP . Use the restroom before checking your BP (a full bladder can raise your pressure) . Relax in a comfortable upright chair . Feet on the ground . Left arm resting comfortably on a flat surface at the level of your heart . Legs uncrossed . Back supported . Sit quietly and don't talk . Place the cuff on your bare arm . Adjust snuggly, so that only two fingertips can fit between your skin and  the top of the cuff . Check 2 readings separated by at least one minute . Keep a log of your BP readings . For a visual, please reference this diagram: http://ccnc.care/bpdiagram  Provider Name: [facility]     Phone: [phone]  Zone 1: ALL CLEAR  Continue to monitor your symptoms:  . BP reading is less than 140 (top number) or less than 90 (bottom number)  . No right upper stomach pain . No headaches or seeing spots . No feeling nauseated or throwing up . No swelling in face and hands  Zone 2: CAUTION Call your doctor's office for any of the following:  . BP reading is greater than 140 (top number) or greater than 90 (bottom number)  . Stomach pain under your ribs in the middle or right side . Headaches or seeing spots . Feeling nauseated or throwing up . Swelling in face and hands  Zone 3: EMERGENCY  Seek immediate medical care if you have any of the following:  . BP reading is greater than160 (top number) or greater than 110 (bottom number) . Severe headaches not improving with Tylenol . Serious difficulty catching your breath . Any worsening symptoms from Zone 2   Third Trimester of Pregnancy The third trimester is from week 29 through week 42, months 7 through 9. The third trimester is a time when the fetus is growing rapidly. At the end of the ninth month, the fetus is about 20 inches in length and weighs 6-10 pounds.  BODY CHANGES Your body goes through many changes during pregnancy. The changes vary from woman to woman.   Your weight will continue to increase. You can expect to gain 25-35 pounds (11-16 kg) by the end of the pregnancy.  You may begin to get stretch marks on your hips, abdomen, and breasts.  You may urinate more often because the fetus is moving lower into your pelvis and pressing on your bladder.  You may develop or continue to have heartburn as a result of your pregnancy.  You may develop constipation because certain hormones are causing  the muscles that push waste through your intestines to slow down.  You may develop hemorrhoids or swollen, bulging veins (varicose veins).  You may have pelvic pain because of the weight gain and pregnancy hormones relaxing your joints between the bones in your pelvis. Backaches may result from overexertion of the muscles supporting your posture.  You may have changes in your hair. These can include thickening of your hair, rapid growth, and changes in texture. Some women also have hair loss during or after pregnancy, or hair that feels dry or thin. Your hair will most likely return to normal after your baby is born.  Your breasts will continue to grow and be tender. A yellow discharge may leak from your breasts called colostrum.  Your belly button  may stick out.  You may feel short of breath because of your expanding uterus.  You may notice the fetus "dropping," or moving lower in your abdomen.  You may have a bloody mucus discharge. This usually occurs a few days to a week before labor begins.  Your cervix becomes thin and soft (effaced) near your due date. WHAT TO EXPECT AT YOUR PRENATAL EXAMS  You will have prenatal exams every 2 weeks until week 36. Then, you will have weekly prenatal exams. During a routine prenatal visit:  You will be weighed to make sure you and the fetus are growing normally.  Your blood pressure is taken.  Your abdomen will be measured to track your baby's growth.  The fetal heartbeat will be listened to.  Any test results from the previous visit will be discussed.  You may have a cervical check near your due date to see if you have effaced. At around 36 weeks, your caregiver will check your cervix. At the same time, your caregiver will also perform a test on the secretions of the vaginal tissue. This test is to determine if a type of bacteria, Group B streptococcus, is present. Your caregiver will explain this further. Your caregiver may ask you:  What  your birth plan is.  How you are feeling.  If you are feeling the baby move.  If you have had any abnormal symptoms, such as leaking fluid, bleeding, severe headaches, or abdominal cramping.  If you have any questions. Other tests or screenings that may be performed during your third trimester include:  Blood tests that check for low iron levels (anemia).  Fetal testing to check the health, activity level, and growth of the fetus. Testing is done if you have certain medical conditions or if there are problems during the pregnancy. FALSE LABOR You may feel small, irregular contractions that eventually go away. These are called Braxton Hicks contractions, or false labor. Contractions may last for hours, days, or even weeks before true labor sets in. If contractions come at regular intervals, intensify, or become painful, it is best to be seen by your caregiver.  SIGNS OF LABOR   Menstrual-like cramps.  Contractions that are 5 minutes apart or less.  Contractions that start on the top of the uterus and spread down to the lower abdomen and back.  A sense of increased pelvic pressure or back pain.  A watery or bloody mucus discharge that comes from the vagina. If you have any of these signs before the 37th week of pregnancy, call your caregiver right away. You need to go to the hospital to get checked immediately. HOME CARE INSTRUCTIONS   Avoid all smoking, herbs, alcohol, and unprescribed drugs. These chemicals affect the formation and growth of the baby.  Follow your caregiver's instructions regarding medicine use. There are medicines that are either safe or unsafe to take during pregnancy.  Exercise only as directed by your caregiver. Experiencing uterine cramps is a good sign to stop exercising.  Continue to eat regular, healthy meals.  Wear a good support bra for breast tenderness.  Do not use hot tubs, steam rooms, or saunas.  Wear your seat belt at all times when  driving.  Avoid raw meat, uncooked cheese, cat litter boxes, and soil used by cats. These carry germs that can cause birth defects in the baby.  Take your prenatal vitamins.  Try taking a stool softener (if your caregiver approves) if you develop constipation. Eat more high-fiber foods, such  as fresh vegetables or fruit and whole grains. Drink plenty of fluids to keep your urine clear or pale yellow.  Take warm sitz baths to soothe any pain or discomfort caused by hemorrhoids. Use hemorrhoid cream if your caregiver approves.  If you develop varicose veins, wear support hose. Elevate your feet for 15 minutes, 3-4 times a day. Limit salt in your diet.  Avoid heavy lifting, wear low heal shoes, and practice good posture.  Rest a lot with your legs elevated if you have leg cramps or low back pain.  Visit your dentist if you have not gone during your pregnancy. Use a soft toothbrush to brush your teeth and be gentle when you floss.  A sexual relationship may be continued unless your caregiver directs you otherwise.  Do not travel far distances unless it is absolutely necessary and only with the approval of your caregiver.  Take prenatal classes to understand, practice, and ask questions about the labor and delivery.  Make a trial run to the hospital.  Pack your hospital bag.  Prepare the baby's nursery.  Continue to go to all your prenatal visits as directed by your caregiver. SEEK MEDICAL CARE IF:  You are unsure if you are in labor or if your water has broken.  You have dizziness.  You have mild pelvic cramps, pelvic pressure, or nagging pain in your abdominal area.  You have persistent nausea, vomiting, or diarrhea.  You have a bad smelling vaginal discharge.  You have pain with urination. SEEK IMMEDIATE MEDICAL CARE IF:   You have a fever.  You are leaking fluid from your vagina.  You have spotting or bleeding from your vagina.  You have severe abdominal cramping or  pain.  You have rapid weight loss or gain.  You have shortness of breath with chest pain.  You notice sudden or extreme swelling of your face, hands, ankles, feet, or legs.  You have not felt your baby move in over an hour.  You have severe headaches that do not go away with medicine.  You have vision changes. Document Released: 09/15/2001 Document Revised: 09/26/2013 Document Reviewed: 11/22/2012  [facility] Patient Information 2015 [place], [place]. This information is not intended to replace advice given to you by your health care provider. Make sure you discuss any questions you have with your health care provider.  PROTECT YOURSELF & YOUR BABY FROM THE FLU! Because you are pregnant, we at [facility], along with the Centers for Disease Control (CDC), recommend that you receive the flu vaccine to protect yourself and your baby from the flu. The flu is more likely to cause severe illness in pregnant women than in women of reproductive age who are not pregnant. Changes in the immune system, heart, and lungs during pregnancy make pregnant women (and women up to two weeks postpartum) more prone to severe illness from flu, including illness resulting in hospitalization. Flu also may be harmful for a pregnant woman's developing baby. A common flu symptom is fever, which may be associated with neural tube defects and other adverse outcomes for a developing baby. Getting vaccinated can also help protect a baby after birth from flu. (Mom passes antibodies onto the developing baby during her pregnancy.)  A Flu Vaccine is the Best Protection Against Flu Getting a flu vaccine is the first and most important step in protecting against flu. Pregnant women should get a flu shot and not the live attenuated influenza vaccine (LAIV), also known as nasal spray flu vaccine. Flu vaccines  given during pregnancy help protect both the mother and her baby from flu. Vaccination has been shown to reduce the risk of  flu-associated acute respiratory infection in pregnant women by up to one-half. A 2018 study showed that getting a flu shot reduced a pregnant woman's risk of being hospitalized with flu by an average of 40 percent. Pregnant women who get a flu vaccine are also helping to protect their babies from flu illness for the first several months after their birth, when they are too young to get vaccinated.   A Long Record of Safety for Flu Shots in Pregnant Women Flu shots have been given to millions of pregnant women over many years with a good safety record. There is a lot of evidence that flu vaccines can be given safely during pregnancy; though these data are limited for the first trimester. The CDC recommends that pregnant women get vaccinated during any trimester of their pregnancy. It is very important for pregnant women to get the flu shot.   Other Preventive Actions In addition to getting a flu shot, pregnant women should take the same everyday preventive actions the CDC recommends of everyone, including covering coughs, washing hands often, and avoiding people who are sick.  Symptoms and Treatment If you get sick with flu symptoms call your doctor right away. There are antiviral drugs that can treat flu illness and prevent serious flu complications. The CDC recommends prompt treatment for people who have influenza infection or suspected influenza infection and who are at high risk of serious flu complications, such as people with asthma, diabetes (including gestational diabetes), or heart disease. Early treatment of influenza in hospitalized pregnant women has been shown to reduce the length of the hospital stay.  Symptoms Flu symptoms include fever, cough, sore throat, runny or stuffy nose, body aches, headache, chills and fatigue. Some people may also have vomiting and diarrhea. People may be infected with the flu and have respiratory symptoms without a fever.  Early Treatment is Important for  Pregnant Women Treatment should begin as soon as possible because antiviral drugs work best when started early (within 48 hours after symptoms start). Antiviral drugs can make your flu illness milder and make you feel better faster. They may also prevent serious health problems that can result from flu illness. Oral oseltamivir (Tamiflu) is the preferred treatment for pregnant women because it has the most studies available to suggest that it is safe and beneficial. Antiviral drugs require a prescription from your provider. Having a fever caused by flu infection or other infections early in pregnancy may be linked to birth defects in a baby. In addition to taking antiviral drugs, pregnant women who get a fever should treat their fever with Tylenol (acetaminophen) and contact their provider immediately.  When to Seek Emergency Medical Care If you are pregnant and have any of these signs, seek care immediately:  Difficulty breathing or shortness of breath  Pain or pressure in the chest or abdomen  Sudden dizziness  Confusion  Severe or persistent vomiting  High fever that is not responding to Tylenol (or store brand equivalent)  Decreased or no movement of your baby  [email or web address]

## 2019-09-04 ENCOUNTER — Other Ambulatory Visit: Payer: Self-pay

## 2019-09-04 ENCOUNTER — Ambulatory Visit (INDEPENDENT_AMBULATORY_CARE_PROVIDER_SITE_OTHER): Payer: Medicaid Other | Admitting: Advanced Practice Midwife

## 2019-09-04 VITALS — BP 126/80 | HR 120 | Wt 227.0 lb

## 2019-09-04 DIAGNOSIS — Z331 Pregnant state, incidental: Secondary | ICD-10-CM

## 2019-09-04 DIAGNOSIS — Z1389 Encounter for screening for other disorder: Secondary | ICD-10-CM

## 2019-09-04 DIAGNOSIS — Z3483 Encounter for supervision of other normal pregnancy, third trimester: Secondary | ICD-10-CM

## 2019-09-04 DIAGNOSIS — Z3A32 32 weeks gestation of pregnancy: Secondary | ICD-10-CM

## 2019-09-04 DIAGNOSIS — Z3480 Encounter for supervision of other normal pregnancy, unspecified trimester: Secondary | ICD-10-CM

## 2019-09-04 DIAGNOSIS — Z23 Encounter for immunization: Secondary | ICD-10-CM | POA: Diagnosis not present

## 2019-09-04 NOTE — Addendum Note (Signed)
Addended by: Armond Hang on: 09/04/2019 03:04 PM   Modules accepted: Orders

## 2019-09-04 NOTE — Patient Instructions (Signed)
Brittany Kim, I greatly value your feedback.  If you receive a survey following your visit with [name] today, we appreciate you taking the time to fill it out.  Thanks, [name], CNM   [place]!!! It is now [place] at [facility] ([place], [place] [address]) Entrance located off of [address] parking   Go to [email or web address] to register for FREE online childbirth classes    Call the office [phone]) or go to [facility] if:  You begin to have strong, frequent contractions  Your water breaks.  Sometimes it is a big gush of fluid, sometimes it is just a trickle that keeps getting your panties wet or running down your legs  You have vaginal bleeding.  It is normal to have a small amount of spotting if your cervix was checked.   You don't feel your baby moving like normal.  If you don't, get you something to eat and drink and lay down and focus on feeling your baby move.  You should feel at least 10 movements in 2 hours.  If you don't, you should call the office or go to [facility].    Tdap Vaccine  It is recommended that you get the Tdap vaccine during the third trimester of EACH pregnancy to help protect your baby from getting pertussis (whooping cough)  27-36 weeks is the BEST time to do this so that you can pass the protection on to your baby. During pregnancy is better than after pregnancy, but if you are unable to get it during pregnancy it will be offered at the hospital.   You will be offered this vaccine in the office after 27 weeks. If you do not have health insurance, you can get this vaccine at the health department or your family doctor  Everyone who will be around your baby should also be up-to-date on their vaccines. Adults (who are not pregnant) only need 1 dose of Tdap during adulthood.   Third Trimester of Pregnancy The third trimester is from week 29 through week 42, months 7 through 9.  The third trimester is a time when the fetus is growing rapidly. At the end of the ninth month, the fetus is about 20 inches in length and weighs 6-10 pounds.  BODY CHANGES Your body goes through many changes during pregnancy. The changes vary from woman to woman.   Your weight will continue to increase. You can expect to gain 25-35 pounds (11-16 kg) by the end of the pregnancy.  You may begin to get stretch marks on your hips, abdomen, and breasts.  You may urinate more often because the fetus is moving lower into your pelvis and pressing on your bladder.  You may develop or continue to have heartburn as a result of your pregnancy.  You may develop constipation because certain hormones are causing the muscles that push waste through your intestines to slow down.  You may develop hemorrhoids or swollen, bulging veins (varicose veins).  You may have pelvic pain because of the weight gain and pregnancy hormones relaxing your joints between the bones in your pelvis. Backaches may result from overexertion of the muscles supporting your posture.  You may have changes in your hair. These can include thickening of your hair, rapid growth, and changes in texture. Some women also have hair loss during or after pregnancy, or hair that feels dry or thin. Your hair will most likely return to  normal after your baby is born.  Your breasts will continue to grow and be tender. A yellow discharge may leak from your breasts called colostrum.  Your belly button may stick out.  You may feel short of breath because of your expanding uterus.  You may notice the fetus "dropping," or moving lower in your abdomen.  You may have a bloody mucus discharge. This usually occurs a few days to a week before labor begins.  Your cervix becomes thin and soft (effaced) near your due date. WHAT TO EXPECT AT YOUR PRENATAL EXAMS  You will have prenatal exams every 2 weeks until week 36. Then, you will have weekly prenatal  exams. During a routine prenatal visit:  You will be weighed to make sure you and the fetus are growing normally.  Your blood pressure is taken.  Your abdomen will be measured to track your baby's growth.  The fetal heartbeat will be listened to.  Any test results from the previous visit will be discussed.  You may have a cervical check near your due date to see if you have effaced. At around 36 weeks, your caregiver will check your cervix. At the same time, your caregiver will also perform a test on the secretions of the vaginal tissue. This test is to determine if a type of bacteria, Group B streptococcus, is present. Your caregiver will explain this further. Your caregiver may ask you:  What your birth plan is.  How you are feeling.  If you are feeling the baby move.  If you have had any abnormal symptoms, such as leaking fluid, bleeding, severe headaches, or abdominal cramping.  If you have any questions. Other tests or screenings that may be performed during your third trimester include:  Blood tests that check for low iron levels (anemia).  Fetal testing to check the health, activity level, and growth of the fetus. Testing is done if you have certain medical conditions or if there are problems during the pregnancy. FALSE LABOR You may feel small, irregular contractions that eventually go away. These are called Braxton Hicks contractions, or false labor. Contractions may last for hours, days, or even weeks before true labor sets in. If contractions come at regular intervals, intensify, or become painful, it is best to be seen by your caregiver.  SIGNS OF LABOR   Menstrual-like cramps.  Contractions that are 5 minutes apart or less.  Contractions that start on the top of the uterus and spread down to the lower abdomen and back.  A sense of increased pelvic pressure or back pain.  A watery or bloody mucus discharge that comes from the vagina. If you have any of these  signs before the 37th week of pregnancy, call your caregiver right away. You need to go to the hospital to get checked immediately. HOME CARE INSTRUCTIONS   Avoid all smoking, herbs, alcohol, and unprescribed drugs. These chemicals affect the formation and growth of the baby.  Follow your caregiver's instructions regarding medicine use. There are medicines that are either safe or unsafe to take during pregnancy.  Exercise only as directed by your caregiver. Experiencing uterine cramps is a good sign to stop exercising.  Continue to eat regular, healthy meals.  Wear a good support bra for breast tenderness.  Do not use hot tubs, steam rooms, or saunas.  Wear your seat belt at all times when driving.  Avoid raw meat, uncooked cheese, cat litter boxes, and soil used by cats. These carry germs that can  cause birth defects in the baby.  Take your prenatal vitamins.  Try taking a stool softener (if your caregiver approves) if you develop constipation. Eat more high-fiber foods, such as fresh vegetables or fruit and whole grains. Drink plenty of fluids to keep your urine clear or pale yellow.  Take warm sitz baths to soothe any pain or discomfort caused by hemorrhoids. Use hemorrhoid cream if your caregiver approves.  If you develop varicose veins, wear support hose. Elevate your feet for 15 minutes, 3-4 times a day. Limit salt in your diet.  Avoid heavy lifting, wear low heal shoes, and practice good posture.  Rest a lot with your legs elevated if you have leg cramps or low back pain.  Visit your dentist if you have not gone during your pregnancy. Use a soft toothbrush to brush your teeth and be gentle when you floss.  A sexual relationship may be continued unless your caregiver directs you otherwise.  Do not travel far distances unless it is absolutely necessary and only with the approval of your caregiver.  Take prenatal classes to understand, practice, and ask questions about the  labor and delivery.  Make a trial run to the hospital.  Pack your hospital bag.  Prepare the baby's nursery.  Continue to go to all your prenatal visits as directed by your caregiver. SEEK MEDICAL CARE IF:  You are unsure if you are in labor or if your water has broken.  You have dizziness.  You have mild pelvic cramps, pelvic pressure, or nagging pain in your abdominal area.  You have persistent nausea, vomiting, or diarrhea.  You have a bad smelling vaginal discharge.  You have pain with urination. SEEK IMMEDIATE MEDICAL CARE IF:   You have a fever.  You are leaking fluid from your vagina.  You have spotting or bleeding from your vagina.  You have severe abdominal cramping or pain.  You have rapid weight loss or gain.  You have shortness of breath with chest pain.  You notice sudden or extreme swelling of your face, hands, ankles, feet, or legs.  You have not felt your baby move in over an hour.  You have severe headaches that do not go away with medicine.  You have vision changes. Document Released: 09/15/2001 Document Revised: 09/26/2013 Document Reviewed: 11/22/2012 [facility] Patient Information 2015 [place], [place]. This information is not intended to replace advice given to you by your health care provider. Make sure you discuss any questions you have with your health care provider.
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
## 2019-09-11 DIAGNOSIS — Z20828 Contact with and (suspected) exposure to other viral communicable diseases: Secondary | ICD-10-CM | POA: Diagnosis not present

## 2019-09-15 ENCOUNTER — Encounter: Payer: Self-pay | Admitting: *Deleted

## 2019-09-18 ENCOUNTER — Other Ambulatory Visit: Payer: Self-pay

## 2019-09-18 ENCOUNTER — Encounter: Payer: Self-pay | Admitting: Obstetrics & Gynecology

## 2019-09-18 ENCOUNTER — Telehealth (INDEPENDENT_AMBULATORY_CARE_PROVIDER_SITE_OTHER): Payer: Medicaid Other | Admitting: Obstetrics & Gynecology

## 2019-09-18 VITALS — BP 102/81 | HR 93

## 2019-09-18 DIAGNOSIS — Z3483 Encounter for supervision of other normal pregnancy, third trimester: Secondary | ICD-10-CM | POA: Diagnosis not present

## 2019-09-18 DIAGNOSIS — Z3A34 34 weeks gestation of pregnancy: Secondary | ICD-10-CM | POA: Diagnosis not present

## 2019-09-18 DIAGNOSIS — Z3480 Encounter for supervision of other normal pregnancy, unspecified trimester: Secondary | ICD-10-CM

## 2019-09-18 NOTE — Progress Notes (Signed)
   [facility] VIRTUAL OBSTETRICS VISIT ENCOUNTER NOTE  I connected with Brittany Kim on 09/18/19 at  3:10 PM EST by telephone at home and verified that I am speaking with the correct person using two identifiers.   I discussed the limitations, risks, security and privacy concerns of performing an evaluation and management service by telephone and the availability of in person appointments. I also discussed with the patient that there may be a patient responsible charge related to this service. The patient expressed understanding and agreed to proceed.  Subjective:  Brittany Kim is a 18 y.o. G2P1001 at [redacted]w[redacted]d being followed for ongoing prenatal care.  She is currently monitored for the following issues for this low-risk pregnancy and has Asymptomatic bacteriuria during pregnancy in second trimester; Supervision of normal pregnancy; and Depression with anxiety on their problem list.  Patient reports back pain sciatica pelvic pressure. Reports fetal movement. Denies any contractions, bleeding or leaking of fluid.   The following portions of the patient's history were reviewed and updated as appropriate: allergies, current medications, past family history, past medical history, past social history, past surgical history and problem list.   Objective:   General:  Alert, oriented and cooperative.   Mental Status: Normal mood and affect perceived. Normal judgment and thought content.  Rest of physical exam deferred due to type of encounter  Assessment and Plan:  Pregnancy: G2P1001 at [redacted]w[redacted]d There are no diagnoses linked to this encounter. Term labor symptoms and general obstetric precautions including but not limited to vaginal bleeding, contractions, leaking of fluid and fetal movement were reviewed in detail with the patient.  I discussed the assessment and treatment plan with the patient. The patient was provided an opportunity to ask questions and all were answered. The patient  agreed with the plan and demonstrated an understanding of the instructions. The patient was advised to call back or seek an in-person office evaluation/go to [facility] at [facility] for any urgent or concerning symptoms. Please refer to After Visit Summary for other counseling recommendations.   I provided 11 minutes of non-face-to-face time during this encounter.  Return in about 2 weeks (around 10/02/2019) for LROB, cultures.  No future appointments.  Florian Buff, MD [facility] [employer] [facility], [place]

## 2019-10-02 ENCOUNTER — Encounter: Payer: Medicaid Other | Admitting: Advanced Practice Midwife

## 2019-10-06 NOTE — L&D Delivery Note (Addendum)
OB/GYN Faculty Practice Delivery Note  Brittany Kim is a 19 y.o. G2P1001 s/p SVD at [redacted]w[redacted]d. She was admitted for active labor.   ROM: 1h 9m with clear fluid GBS Status: --Brittany Kim (01/06 1430)  Labor Progress: Patient presented to L&D for active labor. Initial SVE: 6cm. She then progressed to complete.   Delivery Date/Time: 11/04/2019 @ 1750 Delivery: Called to room and patient was complete and pushing. Head delivered without difficulty. No nuchal cord present. Shoulder delivery with gentle downward traction and body delivered in usual fashion. Infant with spontaneous cry, placed on mother's abdomen, dried and stimulated. Placenta delivered spontaneously with gentle cord traction while still attached to infant per patient request. Fundus firm with massage and Pitocin. Labia, perineum, vagina, and cervix inspected inspected, periurethral laceration present and repair with 3-0 rapid.  Cord clamped x 2 after 1-minute delay, and cut by FOB.   Mother and baby stable and bonding.   Placenta: Sent to L&D Complications: None Lacerations: Peri urethral  EBL: 100 Analgesia: Epidural   Infant: APGAR (1 MIN): 9   APGAR (5 MINS): 9    Amanda Hallman, SNM, RNC-OB    Patient is a 19 y.o. at [redacted]w[redacted]d who was admitted for spontaneous onset of labor, uncomplicated prenatal course.  She progressed with augmentation via AROM.  I was gloved and present for delivery in its entirety.  Second stage of labor progressed, baby delivered after 3 contractions.   Complications: none  Lacerations: periurethral  EBL: 100  Rolm Bookbinder, [place] 6:48 PM

## 2019-10-11 ENCOUNTER — Encounter: Payer: Self-pay | Admitting: Obstetrics & Gynecology

## 2019-10-11 ENCOUNTER — Ambulatory Visit (INDEPENDENT_AMBULATORY_CARE_PROVIDER_SITE_OTHER): Payer: Medicaid Other | Admitting: Obstetrics & Gynecology

## 2019-10-11 ENCOUNTER — Other Ambulatory Visit: Payer: Self-pay

## 2019-10-11 ENCOUNTER — Other Ambulatory Visit (HOSPITAL_COMMUNITY)
Admission: RE | Admit: 2019-10-11 | Discharge: 2019-10-11 | Disposition: A | Discharge status: Home / Self Care | Payer: Medicaid Other | Source: Ambulatory Visit | Attending: Advanced Practice Midwife | Admitting: Advanced Practice Midwife

## 2019-10-11 VITALS — BP 115/76 | HR 112 | Wt 232.5 lb

## 2019-10-11 DIAGNOSIS — Z1389 Encounter for screening for other disorder: Secondary | ICD-10-CM

## 2019-10-11 DIAGNOSIS — Z3483 Encounter for supervision of other normal pregnancy, third trimester: Secondary | ICD-10-CM

## 2019-10-11 DIAGNOSIS — Z3A37 37 weeks gestation of pregnancy: Secondary | ICD-10-CM

## 2019-10-11 DIAGNOSIS — Z331 Pregnant state, incidental: Secondary | ICD-10-CM

## 2019-10-11 LAB — POCT URINALYSIS DIPSTICK OB
Glucose, UA: NEGATIVE
Ketones, UA: NEGATIVE
Nitrite, UA: NEGATIVE
POC,PROTEIN,UA: NEGATIVE

## 2019-10-11 NOTE — Progress Notes (Signed)
   LOW-RISK PREGNANCY VISIT Patient name: Brittany Kim MRN [id number]  Date of birth: [birth year] Chief Complaint:   Routine Prenatal Visit (GBS; GC/CHL; + pelvic pain)  History of Present Illness:   Brittany Kim is a 19 y.o. G46P1001 female at [redacted]w[redacted]d with an Estimated Date of Delivery: 10/29/19 being seen today for ongoing management of a low-risk pregnancy.  Today she reports no complaints. Contractions: Not present. Vag. Bleeding: None.  Movement: Present. denies leaking of fluid. Review of Systems:   Pertinent items are noted in HPI Denies abnormal vaginal discharge w/ itching/odor/irritation, headaches, visual changes, shortness of breath, chest pain, abdominal pain, severe nausea/vomiting, or problems with urination or bowel movements unless otherwise stated above. Pertinent History Reviewed:  Reviewed past medical,surgical, social, obstetrical and family history.  Reviewed problem list, medications and allergies. Physical Assessment:   Vitals:   10/11/19 1121  BP: 115/76  Pulse: (!) 112  Weight: 232 lb 8 oz (105.5 kg)  Body mass index is 36.41 kg/m.        Physical Examination:   General appearance: Well appearing, and in no distress  Mental status: Alert, oriented to person, place, and time  Skin: Warm & dry  Cardiovascular: Normal heart rate noted  Respiratory: Normal respiratory effort, no distress  Abdomen: Soft, gravid, nontender  Pelvic: Cervical exam performed  Dilation: 1 Effacement (%): Thick Station: Ballotable  Extremities: Edema: None  Fetal Status: Fetal Heart Rate (bpm): 145 Fundal Height: 40 cm Movement: Present Presentation: Vertex  Chaperone: Malachy Mood    Results for orders placed or performed in visit on 10/11/19 (from the past 24 hour(s))  POC Urinalysis Dipstick OB   Collection Time: 10/11/19 11:22 AM  Result Value Ref Range   Color, UA     Clarity, UA     Glucose, UA Negative Negative   Bilirubin, UA     Ketones, UA neg    Spec Grav, UA      Blood, UA trace    pH, UA     POC,PROTEIN,UA Negative Negative, Trace, Small (1+), Moderate (2+), Large (3+), 4+   Urobilinogen, UA     Nitrite, UA neg    Leukocytes, UA Moderate (2+) (A) Negative   Appearance     Odor      Assessment & Plan:  1) Low-risk pregnancy G2P1001 at [redacted]w[redacted]d with an Estimated Date of Delivery: 10/29/19   2) ,    Meds: No orders of the defined types were placed in this encounter.  Labs/procedures today: cultures  Plan:  Continue routine obstetrical care  Next visit: prefers in person    Reviewed: Term labor symptoms and general obstetric precautions including but not limited to vaginal bleeding, contractions, leaking of fluid and fetal movement were reviewed in detail with the patient.  All questions were answered. Has home bp cuff. Rx faxed to . Check bp weekly, let us know if >140/90.   Follow-up: Return in about 1 week (around 10/18/2019) for LROB.  Orders Placed This Encounter  Procedures  . Strep Gp B NAA  . POC Urinalysis Dipstick OB   Lazaro Arms  10/11/2019 11:36 AM

## 2019-10-12 LAB — CERVICOVAGINAL ANCILLARY ONLY
Chlamydia: NEGATIVE
Comment: NEGATIVE
Comment: NORMAL
Neisseria Gonorrhea: NEGATIVE

## 2019-10-13 LAB — STREP GP B NAA: Strep Gp B NAA: NEGATIVE

## 2019-10-18 ENCOUNTER — Other Ambulatory Visit: Payer: Self-pay

## 2019-10-18 ENCOUNTER — Encounter: Payer: Self-pay | Admitting: Obstetrics and Gynecology

## 2019-10-18 ENCOUNTER — Ambulatory Visit (INDEPENDENT_AMBULATORY_CARE_PROVIDER_SITE_OTHER): Payer: Medicaid Other | Admitting: Obstetrics and Gynecology

## 2019-10-18 VITALS — BP 118/81 | HR 107 | Wt 236.6 lb

## 2019-10-18 DIAGNOSIS — Z1389 Encounter for screening for other disorder: Secondary | ICD-10-CM

## 2019-10-18 DIAGNOSIS — Z3483 Encounter for supervision of other normal pregnancy, third trimester: Secondary | ICD-10-CM

## 2019-10-18 DIAGNOSIS — Z3A38 38 weeks gestation of pregnancy: Secondary | ICD-10-CM

## 2019-10-18 DIAGNOSIS — Z331 Pregnant state, incidental: Secondary | ICD-10-CM

## 2019-10-18 LAB — POCT URINALYSIS DIPSTICK OB
Blood, UA: NEGATIVE
Glucose, UA: NEGATIVE
Ketones, UA: NEGATIVE
Nitrite, UA: NEGATIVE
POC,PROTEIN,UA: NEGATIVE

## 2019-10-18 NOTE — Progress Notes (Signed)
Patient ID: Brittany Kim, female   DOB: 12-12-2000, 19 y.o.   MRN: [id number]   LOW-RISK PREGNANCY VISIT Patient name: Brittany Kim MRN [id number]  Date of birth: Apr 24, 2001 Chief Complaint:   Routine Prenatal Visit  History of Present Illness:   Brittany Kim is a 19 y.o. G60P1001 female at [redacted]w[redacted]d with an Estimated Date of Delivery: 10/29/19 being seen today for ongoing management of a low-risk pregnancy.  Today she reports that she experiences some lower back pain when she moves too much. Her legs "give out" sometimes but she hasn't fallen. She did not experience this discomfort during her last pregnancy. She is planning to breastfeed this baby. She breast fed her first child for 6 months but would like to go longer this time, if possible.  The pt is interested in using BCPs after this pregnancy. She has used them before without issues. She notes that she does not want Depo because she is concerned about weight gain and does not want an IUD(copper) because she had one before and it made her "crazy" and irritable.  Contractions: Not present. Vag. Bleeding: None.  Movement: Present. denies leaking of fluid. Review of Systems:   Pertinent items are noted in HPI Denies abnormal vaginal discharge w/ itching/odor/irritation, headaches, visual changes, shortness of breath, chest pain, abdominal pain, severe nausea/vomiting, or problems with urination or bowel movements unless otherwise stated above. Pertinent History Reviewed:  Reviewed past medical,surgical, social, obstetrical and family history.  Reviewed problem list, medications and allergies. Physical Assessment:   Vitals:   10/18/19 1425  BP: 118/81  Pulse: (!) 107  Weight: 236 lb 9.6 oz (107.3 kg)  Body mass index is 37.06 kg/m.        Physical Examination:   General appearance: Well appearing, and in no distress  Mental status: Alert, oriented to person, place, and time  Skin: Warm & dry  Cardiovascular: Normal heart rate  noted  Respiratory: Normal respiratory effort, no distress  Abdomen: Soft, gravid, nontender  Pelvic: Cervical exam deferred         Extremities: Edema: None  Fetal Status: Fetal Heart Rate (bpm): 147 Fundal Height: 39 cm Movement: Present    Results for orders placed or performed in visit on 10/18/19 (from the past 24 hour(s))  POC Urinalysis Dipstick OB   Collection Time: 10/18/19  2:26 PM  Result Value Ref Range   Color, UA     Clarity, UA     Glucose, UA Negative Negative   Bilirubin, UA     Ketones, UA neg    Spec Grav, UA     Blood, UA neg    pH, UA     POC,PROTEIN,UA Negative Negative, Trace, Small (1+), Moderate (2+), Large (3+), 4+   Urobilinogen, UA     Nitrite, UA neg    Leukocytes, UA Trace (A) Negative   Appearance     Odor      Assessment & Plan:  1) Low-risk pregnancy G2P1001 at [redacted]w[redacted]d with an Estimated Date of Delivery: 10/29/19  2) Plan to use mini-pill for contraception while breastfeeding, then switch to combination pill    Plan:  Continue routine obstetrical care  Meds: No orders of the defined types were placed in this encounter.  Labs/procedures today: none  Reviewed: Term labor symptoms and general obstetric precautions including but not limited to vaginal bleeding, contractions, leaking of fluid and fetal movement were reviewed in detail with the patient.  All questions were answered  Follow-up: Return  in about 1 week (around 10/25/2019) for LROB.  Orders Placed This Encounter  Procedures  . POC Urinalysis Dipstick OB   By signing my name below, I, De Burrs, attest that this documentation has been prepared under the direction and in the presence of Jonnie Kind, MD. Electronically Signed: De Burrs, Medical Scribe. 10/18/19. 2:41 PM.  I personally performed the services described in this documentation, which was SCRIBED in my presence. The recorded information has been reviewed and considered accurate. It has been edited as necessary  during review. Jonnie Kind, MD

## 2019-10-26 ENCOUNTER — Other Ambulatory Visit: Payer: Self-pay

## 2019-10-26 ENCOUNTER — Ambulatory Visit (INDEPENDENT_AMBULATORY_CARE_PROVIDER_SITE_OTHER): Payer: Medicaid Other | Admitting: Obstetrics & Gynecology

## 2019-10-26 VITALS — BP 107/77 | HR 126 | Wt 236.0 lb

## 2019-10-26 DIAGNOSIS — Z1389 Encounter for screening for other disorder: Secondary | ICD-10-CM

## 2019-10-26 DIAGNOSIS — Z3A39 39 weeks gestation of pregnancy: Secondary | ICD-10-CM

## 2019-10-26 DIAGNOSIS — Z3483 Encounter for supervision of other normal pregnancy, third trimester: Secondary | ICD-10-CM

## 2019-10-26 LAB — POCT URINALYSIS DIPSTICK OB
Blood, UA: NEGATIVE
Glucose, UA: NEGATIVE
Ketones, UA: NEGATIVE
Leukocytes, UA: NEGATIVE
Nitrite, UA: NEGATIVE
POC,PROTEIN,UA: NEGATIVE

## 2019-10-26 NOTE — Progress Notes (Signed)
   LOW-RISK PREGNANCY VISIT Patient name: Brittany Kim MRN [id number]  Date of birth: 11/19/2000 Chief Complaint:   Routine Prenatal Visit  History of Present Illness:   Brittany Kim is a 19 y.o. G36P1001 female at [redacted]w[redacted]d with an Estimated Date of Delivery: 10/29/19 being seen today for ongoing management of a low-risk pregnancy.  Today she reports no complaints. Contractions: Not present. Vag. Bleeding: None.  Movement: Present. denies leaking of fluid. Review of Systems:   Pertinent items are noted in HPI Denies abnormal vaginal discharge w/ itching/odor/irritation, headaches, visual changes, shortness of breath, chest pain, abdominal pain, severe nausea/vomiting, or problems with urination or bowel movements unless otherwise stated above. Pertinent History Reviewed:  Reviewed past medical,surgical, social, obstetrical and family history.  Reviewed problem list, medications and allergies. Physical Assessment:   Vitals:   10/26/19 1121  BP: 107/77  Pulse: (!) 126  Weight: 236 lb (107 kg)  Body mass index is 36.96 kg/m.        Physical Examination:   General appearance: Well appearing, and in no distress  Mental status: Alert, oriented to person, place, and time  Skin: Warm & dry  Cardiovascular: Normal heart rate noted  Respiratory: Normal respiratory effort, no distress  Abdomen: Soft, gravid, nontender  Pelvic: Cervical exam deferred         Extremities: Edema: None  Fetal Status:     Movement: Present    Chaperone: Amanda Rash    Results for orders placed or performed in visit on 10/26/19 (from the past 24 hour(s))  POC Urinalysis Dipstick OB   Collection Time: 10/26/19 11:25 AM  Result Value Ref Range   Color, UA     Clarity, UA     Glucose, UA Negative Negative   Bilirubin, UA     Ketones, UA neg    Spec Grav, UA     Blood, UA neg    pH, UA     POC,PROTEIN,UA Negative Negative, Trace, Small (1+), Moderate (2+), Large (3+), 4+   Urobilinogen, UA     Nitrite, UA neg    Leukocytes, UA Negative Negative   Appearance     Odor      Assessment & Plan:  1) Low-risk pregnancy G2P1001 at [redacted]w[redacted]d with an Estimated Date of Delivery: 10/29/19   2) Impending post dates, NST next week, IOL 41 weeks if undelivered   Meds: No orders of the defined types were placed in this encounter.  Labs/procedures today:   Plan:  Continue routine obstetrical care  Next visit: prefers in person    Reviewed: Term labor symptoms and general obstetric precautions including but not limited to vaginal bleeding, contractions, leaking of fluid and fetal movement were reviewed in detail with the patient.  All questions were answered. Has home bp cuff. Rx faxed to . Check bp weekly, let us know if >140/90.   Follow-up: Return in about 1 week (around 11/02/2019) for NST, LROB.  Orders Placed This Encounter  Procedures  . POC Urinalysis Dipstick OB   Lazaro Arms  10/26/2019 11:36 AM

## 2019-11-02 ENCOUNTER — Other Ambulatory Visit: Payer: Self-pay | Admitting: Student

## 2019-11-02 ENCOUNTER — Encounter: Payer: Self-pay | Admitting: Family Medicine

## 2019-11-02 ENCOUNTER — Ambulatory Visit (INDEPENDENT_AMBULATORY_CARE_PROVIDER_SITE_OTHER): Payer: Medicaid Other | Admitting: Family Medicine

## 2019-11-02 ENCOUNTER — Other Ambulatory Visit: Payer: Self-pay

## 2019-11-02 VITALS — BP 114/80 | HR 134 | Wt 237.2 lb

## 2019-11-02 DIAGNOSIS — Z1389 Encounter for screening for other disorder: Secondary | ICD-10-CM

## 2019-11-02 DIAGNOSIS — Z3483 Encounter for supervision of other normal pregnancy, third trimester: Secondary | ICD-10-CM | POA: Diagnosis not present

## 2019-11-02 DIAGNOSIS — Z3A40 40 weeks gestation of pregnancy: Secondary | ICD-10-CM | POA: Diagnosis not present

## 2019-11-02 DIAGNOSIS — Z331 Pregnant state, incidental: Secondary | ICD-10-CM

## 2019-11-02 LAB — POCT URINALYSIS DIPSTICK OB
Blood, UA: NEGATIVE
Glucose, UA: NEGATIVE
Ketones, UA: NEGATIVE
Nitrite, UA: NEGATIVE
POC,PROTEIN,UA: NEGATIVE

## 2019-11-02 NOTE — Progress Notes (Signed)
    PRENATAL VISIT NOTE  Subjective:  Brittany Kim is a 19 y.o. G2P1001 at [redacted]w[redacted]d being seen today for ongoing prenatal care.  She is currently monitored for the following issues for this low-risk pregnancy and has Asymptomatic bacteriuria during pregnancy in second trimester; Supervision of normal pregnancy; and Depression with anxiety on their problem list.  Patient reports no complaints.  Contractions: Not present. Vag. Bleeding: None.  Movement: Present. Denies leaking of fluid.   The following portions of the patient's history were reviewed and updated as appropriate: allergies, current medications, past family history, past medical history, past social history, past surgical history and problem list. Problem list updated.  Objective:   Vitals:   11/02/19 1122  BP: 114/80  Pulse: (!) 134  Weight: 237 lb 3.2 oz (107.6 kg)    Fetal Status: Fetal Heart Rate (bpm): 141 Fundal Height: 39 cm Movement: Present  Presentation: Vertex  General:  Alert, oriented and cooperative. Patient is in no acute distress.  Skin: Skin is warm and dry. No rash noted.   Cardiovascular: Normal heart rate noted  Respiratory: Normal respiratory effort, no problems with respiration noted  Abdomen: Soft, gravid, appropriate for gestational age.  Pain/Pressure: Present     Pelvic: Cervical exam performed Dilation: 2 Effacement (%): Thick Station: -3  Extremities: Normal range of motion.  Edema: None  Mental Status: Normal mood and affect. Normal behavior. Normal judgment and thought content.   Assessment and Plan:  Pregnancy: G2P1001 at [redacted]w[redacted]d  1. Encounter for supervision of other normal pregnancy in third trimester IOL Scheduled for 1/31 today-- plan for AM IOL. Instructed patient about COVID test 48 hr prior and quarantine until IOL. Reviewed that she will be called between 6am-11am to present to the hospital. Recommended partner not work on Saturday. Reviewed masking at hospital  Discussed membrane  sweeping today. Reviewed cochrane review data on membrane sweeping. Reviewed risk of cramping, contractions, bleeding and ROM. Answered patient questions and she agreed to proceed with procedure.  2. Screening for genitourinary condition - POC Urinalysis Dipstick OB  3. Pregnant state, incidental - POC Urinalysis Dipstick OB   Preterm labor symptoms and general obstetric precautions including but not limited to vaginal bleeding, contractions, leaking of fluid and fetal movement were reviewed in detail with the patient. Please refer to After Visit Summary for other counseling recommendations.  Return in about 6 weeks (around 12/14/2019) for Postpartum visit.  Future Appointments  Date Time Provider Department Center  11/05/2019  7:00 AM [facility] SCHED ROOM [facility] None    Federico Flake, MD

## 2019-11-03 ENCOUNTER — Other Ambulatory Visit (HOSPITAL_COMMUNITY)
Admission: RE | Admit: 2019-11-03 | Discharge: 2019-11-03 | Disposition: A | Discharge status: Home / Self Care | Payer: Medicaid Other | Source: Ambulatory Visit | Attending: Family Medicine | Admitting: Family Medicine

## 2019-11-03 LAB — SARS CORONAVIRUS 2 (TAT 6-24 HRS): SARS Coronavirus 2: POSITIVE — AB

## 2019-11-04 ENCOUNTER — Encounter (HOSPITAL_COMMUNITY): Payer: Self-pay | Admitting: Obstetrics & Gynecology

## 2019-11-04 ENCOUNTER — Inpatient Hospital Stay (HOSPITAL_COMMUNITY): Payer: Medicaid Other | Admitting: Anesthesiology

## 2019-11-04 ENCOUNTER — Other Ambulatory Visit: Payer: Self-pay

## 2019-11-04 ENCOUNTER — Inpatient Hospital Stay (HOSPITAL_COMMUNITY)
Admission: AD | Admit: 2019-11-04 | Discharge: 2019-11-05 | DRG: 805 | Disposition: A | Discharge status: Home / Self Care | Payer: Medicaid Other | Attending: Obstetrics & Gynecology | Admitting: Obstetrics & Gynecology

## 2019-11-04 DIAGNOSIS — Z3A00 Weeks of gestation of pregnancy not specified: Secondary | ICD-10-CM | POA: Diagnosis not present

## 2019-11-04 DIAGNOSIS — U071 COVID-19: Secondary | ICD-10-CM | POA: Diagnosis present

## 2019-11-04 DIAGNOSIS — O9852 Other viral diseases complicating childbirth: Secondary | ICD-10-CM | POA: Diagnosis not present

## 2019-11-04 DIAGNOSIS — O480 Post-term pregnancy: Principal | ICD-10-CM | POA: Diagnosis present

## 2019-11-04 DIAGNOSIS — O26893 Other specified pregnancy related conditions, third trimester: Secondary | ICD-10-CM | POA: Diagnosis present

## 2019-11-04 DIAGNOSIS — O894 Spinal and epidural anesthesia-induced headache during the puerperium: Secondary | ICD-10-CM | POA: Diagnosis not present

## 2019-11-04 DIAGNOSIS — Z7722 Contact with and (suspected) exposure to environmental tobacco smoke (acute) (chronic): Secondary | ICD-10-CM | POA: Diagnosis present

## 2019-11-04 DIAGNOSIS — Z3A40 40 weeks gestation of pregnancy: Secondary | ICD-10-CM | POA: Diagnosis not present

## 2019-11-04 DIAGNOSIS — Z3490 Encounter for supervision of normal pregnancy, unspecified, unspecified trimester: Secondary | ICD-10-CM

## 2019-11-04 LAB — CBC
HCT: 36.1 % (ref 36.0–46.0)
Hemoglobin: 12.2 g/dL (ref 12.0–15.0)
MCH: 29.4 pg (ref 26.0–34.0)
MCHC: 33.8 g/dL (ref 30.0–36.0)
MCV: 87 fL (ref 80.0–100.0)
Platelets: 189 10*3/uL (ref 150–400)
RBC: 4.15 MIL/uL (ref 3.87–5.11)
RDW: 14.6 % (ref 11.5–15.5)
WBC: 13 10*3/uL — ABNORMAL HIGH (ref 4.0–10.5)
nRBC: 0 % (ref 0.0–0.2)

## 2019-11-04 LAB — TYPE AND SCREEN
ABO/RH(D): AB POS
Antibody Screen: NEGATIVE

## 2019-11-04 MED ORDER — LACTATED RINGERS IV SOLN: INTRAVENOUS | Status: DC

## 2019-11-04 MED ORDER — OXYTOCIN BOLUS FROM INFUSION
500.0000 mL | Freq: Once | INTRAVENOUS | Status: AC
  Administered 2019-11-04: 18:00:00 500 mL via INTRAVENOUS

## 2019-11-04 MED ORDER — DIPHENHYDRAMINE HCL 50 MG/ML IJ SOLN: 12.5000 mg | INTRAMUSCULAR | Status: DC | PRN

## 2019-11-04 MED ORDER — EPHEDRINE 5 MG/ML INJ: 10.0000 mg | INTRAVENOUS | Status: DC | PRN

## 2019-11-04 MED ORDER — OXYCODONE-ACETAMINOPHEN 5-325 MG PO TABS: 1.0000 | ORAL_TABLET | ORAL | Status: DC | PRN

## 2019-11-04 MED ORDER — LIDOCAINE HCL (PF) 1 % IJ SOLN
30.0000 mL | INTRAMUSCULAR | Status: DC | PRN
  Filled 2019-11-04: qty 30

## 2019-11-04 MED ORDER — SODIUM CHLORIDE (PF) 0.9 % IJ SOLN
INTRAMUSCULAR | Status: DC | PRN
  Administered 2019-11-04: 12 mL/h via EPIDURAL

## 2019-11-04 MED ORDER — DIPHENHYDRAMINE HCL 25 MG PO CAPS: 25.0000 mg | ORAL_CAPSULE | Freq: Four times a day (QID) | ORAL | Status: DC | PRN

## 2019-11-04 MED ORDER — ACETAMINOPHEN 325 MG PO TABS
650.0000 mg | ORAL_TABLET | ORAL | Status: DC | PRN
  Administered 2019-11-05 (×2): 650 mg via ORAL
  Filled 2019-11-04 (×2): qty 2

## 2019-11-04 MED ORDER — SOD CITRATE-CITRIC ACID 500-334 MG/5ML PO SOLN: 30.0000 mL | ORAL | Status: DC | PRN

## 2019-11-04 MED ORDER — DIBUCAINE (PERIANAL) 1 % EX OINT: 1.0000 "application " | TOPICAL_OINTMENT | CUTANEOUS | Status: DC | PRN

## 2019-11-04 MED ORDER — LACTATED RINGERS IV SOLN
500.0000 mL | Freq: Once | INTRAVENOUS | Status: AC
  Administered 2019-11-04: 16:00:00 500 mL via INTRAVENOUS

## 2019-11-04 MED ORDER — ZOLPIDEM TARTRATE 5 MG PO TABS: 5.0000 mg | ORAL_TABLET | Freq: Every evening | ORAL | Status: DC | PRN

## 2019-11-04 MED ORDER — COCONUT OIL OIL: 1.0000 "application " | TOPICAL_OIL | Status: DC | PRN

## 2019-11-04 MED ORDER — SENNOSIDES-DOCUSATE SODIUM 8.6-50 MG PO TABS
2.0000 | ORAL_TABLET | ORAL | Status: DC
  Administered 2019-11-04: 2 via ORAL
  Filled 2019-11-04: qty 2

## 2019-11-04 MED ORDER — BENZOCAINE-MENTHOL 20-0.5 % EX AERO
1.0000 "application " | INHALATION_SPRAY | CUTANEOUS | Status: DC | PRN
  Administered 2019-11-05: 1 via TOPICAL
  Filled 2019-11-04: qty 56

## 2019-11-04 MED ORDER — WITCH HAZEL-GLYCERIN EX PADS: 1.0000 "application " | MEDICATED_PAD | CUTANEOUS | Status: DC | PRN

## 2019-11-04 MED ORDER — ONDANSETRON HCL 4 MG/2ML IJ SOLN: 4.0000 mg | Freq: Four times a day (QID) | INTRAMUSCULAR | Status: DC | PRN

## 2019-11-04 MED ORDER — TERBUTALINE SULFATE 1 MG/ML IJ SOLN: 0.2500 mg | Freq: Once | INTRAMUSCULAR | Status: DC | PRN

## 2019-11-04 MED ORDER — PRENATAL MULTIVITAMIN CH
1.0000 | ORAL_TABLET | Freq: Every day | ORAL | Status: DC
  Administered 2019-11-05: 13:00:00 1 via ORAL
  Filled 2019-11-04: qty 1

## 2019-11-04 MED ORDER — OXYTOCIN 40 UNITS IN NORMAL SALINE INFUSION - SIMPLE MED
INTRAVENOUS | Status: AC
  Filled 2019-11-04: qty 1000

## 2019-11-04 MED ORDER — ACETAMINOPHEN 325 MG PO TABS: 650.0000 mg | ORAL_TABLET | ORAL | Status: DC | PRN

## 2019-11-04 MED ORDER — ONDANSETRON HCL 4 MG/2ML IJ SOLN: 4.0000 mg | INTRAMUSCULAR | Status: DC | PRN

## 2019-11-04 MED ORDER — OXYTOCIN 40 UNITS IN NORMAL SALINE INFUSION - SIMPLE MED: 2.5000 [IU]/h | INTRAVENOUS | Status: DC

## 2019-11-04 MED ORDER — PHENYLEPHRINE 40 MCG/ML (10ML) SYRINGE FOR IV PUSH (FOR BLOOD PRESSURE SUPPORT)
80.0000 ug | PREFILLED_SYRINGE | INTRAVENOUS | Status: DC | PRN
  Filled 2019-11-04: qty 10

## 2019-11-04 MED ORDER — IBUPROFEN 600 MG PO TABS
600.0000 mg | ORAL_TABLET | Freq: Four times a day (QID) | ORAL | Status: DC
  Administered 2019-11-04 – 2019-11-05 (×4): 600 mg via ORAL
  Filled 2019-11-04 (×4): qty 1

## 2019-11-04 MED ORDER — PHENYLEPHRINE 40 MCG/ML (10ML) SYRINGE FOR IV PUSH (FOR BLOOD PRESSURE SUPPORT): 80.0000 ug | PREFILLED_SYRINGE | INTRAVENOUS | Status: DC | PRN

## 2019-11-04 MED ORDER — LACTATED RINGERS IV SOLN: 500.0000 mL | INTRAVENOUS | Status: DC | PRN

## 2019-11-04 MED ORDER — OXYCODONE-ACETAMINOPHEN 5-325 MG PO TABS: 2.0000 | ORAL_TABLET | ORAL | Status: DC | PRN

## 2019-11-04 MED ORDER — FENTANYL-BUPIVACAINE-NACL 0.5-0.125-0.9 MG/250ML-% EP SOLN
12.0000 mL/h | EPIDURAL | Status: DC | PRN
  Filled 2019-11-04: qty 250

## 2019-11-04 MED ORDER — SIMETHICONE 80 MG PO CHEW: 80.0000 mg | CHEWABLE_TABLET | ORAL | Status: DC | PRN

## 2019-11-04 MED ORDER — LIDOCAINE HCL (PF) 1 % IJ SOLN
INTRAMUSCULAR | Status: DC | PRN
  Administered 2019-11-04: 10 mL via EPIDURAL

## 2019-11-04 MED ORDER — TETANUS-DIPHTH-ACELL PERTUSSIS 5-2.5-18.5 LF-MCG/0.5 IM SUSP: 0.5000 mL | Freq: Once | INTRAMUSCULAR | Status: DC

## 2019-11-04 MED ORDER — ONDANSETRON HCL 4 MG PO TABS: 4.0000 mg | ORAL_TABLET | ORAL | Status: DC | PRN

## 2019-11-04 NOTE — Anesthesia Procedure Notes (Signed)
Epidural Patient location during procedure: OB Start time: 11/04/2019 4:10 PM End time: 11/04/2019 4:20 PM  Staffing Anesthesiologist: Lucretia Kern, MD Performed: anesthesiologist   Preanesthetic Checklist Completed: patient identified, IV checked, risks and benefits discussed, monitors and equipment checked, pre-op evaluation and timeout performed  Epidural Patient position: sitting Prep: DuraPrep Patient monitoring: heart rate, continuous pulse ox and blood pressure Approach: midline Location: L3-L4 Injection technique: LOR air  Needle:  Needle type: Tuohy  Needle gauge: 17 G Needle length: 9 cm Needle insertion depth: 5 cm Catheter type: closed end flexible Catheter size: 19 Gauge Catheter at skin depth: 10 cm Test dose: negative  Assessment Events: blood not aspirated, injection not painful, no injection resistance, no paresthesia and negative IV test  Additional Notes Reason for block:procedure for pain

## 2019-11-04 NOTE — Anesthesia Preprocedure Evaluation (Signed)
Anesthesia Evaluation  Patient identified by MRN, date of birth, ID band Patient awake    Reviewed: Allergy & Precautions, H&P , NPO status , Patient's Chart, lab work & pertinent test results  History of Anesthesia Complications Negative for: history of anesthetic complications  Airway Mallampati: II  TM Distance: >3 FB Neck ROM: full    Dental no notable dental hx.    Pulmonary  COVID-19 POSITIVE   Pulmonary exam normal        Cardiovascular negative cardio ROS Normal cardiovascular exam Rhythm:regular Rate:Normal     Neuro/Psych  Headaches, negative psych ROS   GI/Hepatic negative GI ROS, Neg liver ROS,   Endo/Other  negative endocrine ROS  Renal/GU negative Renal ROS  negative genitourinary   Musculoskeletal   Abdominal   Peds  Hematology negative hematology ROS (+)   Anesthesia Other Findings   Reproductive/Obstetrics (+) Pregnancy                             Anesthesia Physical Anesthesia Plan  ASA: II  Anesthesia Plan: Epidural   Post-op Pain Management:    Induction:   PONV Risk Score and Plan:   Airway Management Planned:   Additional Equipment:   Intra-op Plan:   Post-operative Plan:   Informed Consent: I have reviewed the patients History and Physical, chart, labs and discussed the procedure including the risks, benefits and alternatives for the proposed anesthesia with the patient or authorized representative who has indicated his/her understanding and acceptance.       Plan Discussed with:   Anesthesia Plan Comments:         Anesthesia Quick Evaluation

## 2019-11-04 NOTE — Progress Notes (Signed)
1/30 @ 0848 Called Dr Alvester Morin office [phone] to inform that patient covid test was +.  Awaiting call back

## 2019-11-04 NOTE — Progress Notes (Signed)
Pt is covid positive

## 2019-11-04 NOTE — MAU Note (Addendum)
Brittany Kim is a 19 y.o. at [redacted]w[redacted]d here in MAU reporting:  +contractions Denies LOF or vaginal bleeding. Onset of complaint: 8am Every 3-5 minutes Pain score: 5/10 Vitals:   11/04/19 1447  BP: 105/63  Pulse: 94  Resp: 19  Temp: 98 F (36.7 C)  SpO2: 99%     FHT: +FM; 140 Lab orders placed from triage: mau labor triage order set  covid + on 1/29. Patient declines any covid symptoms at this time.

## 2019-11-04 NOTE — Discharge Summary (Signed)
Postpartum Discharge Summary    Patient Name: Brittany Kim DOB: 07/24/2001 MRN: [id number]  Date of admission: 11/04/2019 Delivering Provider: Wende Mott   Date of discharge: 11/05/2019  Admitting diagnosis: Post-dates pregnancy [O48.0] Intrauterine pregnancy: [redacted]w[redacted]d    Secondary diagnosis:  Active Problems:   Normal labor  Additional problems: COVID-19     Discharge diagnosis: Term Pregnancy Delivered                                                                                                Post partum procedures:none  Augmentation: AROM  Complications: None  Hospital course:  Onset of Labor With Vaginal Delivery     19y.o. yo G2P1001 at 451w6das admitted in Active Labor on 11/04/2019. Patient had an uncomplicated labor course as follows: AROM and rapid delivery after. Membrane Rupture Time/Date: 4:50 PM ,11/04/2019   Intrapartum Procedures: Episiotomy: None [1]                                         Lacerations:  Periurethral [8]  Patient had a delivery of a Viable infant. 11/04/2019  Information for the patient's newborn:  BaJamayia, Crokerirl Cyrilla [[id number]]Delivery Method: Vag-Spont     Pateint had an uncomplicated postpartum course.  She is ambulating, tolerating a regular diet, passing flatus, and urinating well. Patient is discharged home in stable condition on 11/05/19.  Delivery time: 5:50 PM    Magnesium Sulfate received: No BMZ received: No Rhophylac:No MMR:No Transfusion:No  Physical exam  Vitals:   11/04/19 2358 11/05/19 0400 11/05/19 0814 11/05/19 1504  BP: 116/78 125/84 102/62 101/68  Pulse: 100 100 79 89  Resp: 18 17 18    Temp: 98.3 F (36.8 C) 98.3 F (36.8 C) 98.3 F (36.8 C) 98.4 F (36.9 C)  TempSrc: Oral Oral Oral Oral  SpO2: 100% 100%  100%   General: alert, cooperative and no distress Lochia: appropriate Uterine Fundus: firm Incision: N/A DVT Evaluation: No evidence of DVT seen on physical exam. Labs: Lab  Results  Component Value Date   WBC 13.0 (H) 11/04/2019   HGB 12.2 11/04/2019   HCT 36.1 11/04/2019   MCV 87.0 11/04/2019   PLT 189 11/04/2019   CMP Latest Ref Rng & Units 05/10/2014  Glucose 70 - 99 mg/dL 92  BUN 6 - 23 mg/dL 5(L)  Creatinine 0.47 - 1.00 mg/dL 0.62  Sodium 137 - 147 mEq/L 139  Potassium 3.7 - 5.3 mEq/L 4.1  Chloride 96 - 112 mEq/L 107  CO2 19 - 32 mEq/L 20  Calcium 8.4 - 10.5 mg/dL 8.3(L)  Total Protein 6.0 - 8.3 g/dL -  Total Bilirubin 0.3 - 1.2 mg/dL -  Alkaline Phos 50 - 162 U/L -  AST 0 - 37 U/L -  ALT 0 - 35 U/L -    Discharge instruction: per After Visit Summary and "Baby and Me Booklet".  After visit meds:  Allergies as of 11/05/2019   No Known Allergies  Medication List    STOP taking these medications   Blood Pressure Monitor Misc     TAKE these medications   ibuprofen 600 MG tablet Commonly known as: ADVIL Take 1 tablet (600 mg total) by mouth every 6 (six) hours. Start taking on: November 06, 2019   prenatal vitamin w/FE, FA 27-1 MG Tabs tablet Take 1 tablet by mouth daily at 12 noon.       Diet: routine diet  Activity: Advance as tolerated. Pelvic rest for 6 weeks.   Outpatient follow up:4 weeks Follow up Appt:No future appointments. Follow up Visit: Follow-up Information    [facility] Follow up.   Specialty: Obstetrics and Gynecology Why: As scheduled for prenatal care Contact information: [address] [place] [phone]          Please schedule this patient for PP visit in: 4 weeks Low risk pregnancy complicated by: n/a Delivery mode:  SVD Anticipated Birth Control:  Depo PP Procedures needed: n/a  Schedule Integrated [place] visit: no Provider: Any provider  Newborn Data: Live born female  Birth Weight:   APGAR: 3, 9  Newborn Delivery   Birth date/time: 11/04/2019 17:50:00 Delivery type: Vaginal, Spontaneous      Baby Feeding: Breast Disposition:home with  mother  Wende Mott, Mallory Shirk 11/05/19 6:41 PM

## 2019-11-04 NOTE — Progress Notes (Signed)
1650: SVE 7/100/-1, AROM, clear, small amount  Sherlyn Lees, RNC, SNM

## 2019-11-04 NOTE — Progress Notes (Signed)
1/30 @ 0942 No return call yet.  Called office again to inform of + Covid test.  Awaiting call back
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
## 2019-11-05 ENCOUNTER — Encounter: Payer: Self-pay | Admitting: Medical

## 2019-11-05 ENCOUNTER — Inpatient Hospital Stay (HOSPITAL_COMMUNITY): Payer: Medicaid Other

## 2019-11-05 ENCOUNTER — Inpatient Hospital Stay (HOSPITAL_COMMUNITY): Admission: AD | Admit: 2019-11-05 | Payer: Medicaid Other | Source: Home / Self Care | Admitting: Family Medicine

## 2019-11-05 DIAGNOSIS — U071 COVID-19: Secondary | ICD-10-CM | POA: Insufficient documentation

## 2019-11-05 LAB — RPR: RPR Ser Ql: NONREACTIVE

## 2019-11-05 MED ORDER — IBUPROFEN 600 MG PO TABS: 600.0000 mg | ORAL_TABLET | Freq: Four times a day (QID) | ORAL | 0 refills | Status: DC

## 2019-11-05 NOTE — Anesthesia Postprocedure Evaluation (Signed)
Anesthesia Post Note  Patient: Brittany Kim  Procedure(s) Performed: AN AD HOC LABOR EPIDURAL     Patient location during evaluation: Mother Baby Anesthesia Type: Epidural Level of consciousness: awake and alert Pain management: pain level controlled Vital Signs Assessment: post-procedure vital signs reviewed and stable Respiratory status: spontaneous breathing, nonlabored ventilation and respiratory function stable Cardiovascular status: stable Postop Assessment: no headache, no backache, epidural receding, no apparent nausea or vomiting, patient able to bend at knees, adequate PO intake and able to ambulate Anesthetic complications: no    Last Vitals:  Vitals:   11/04/19 2358 11/05/19 0400  BP: 116/78 125/84  Pulse: 100 100  Resp: 18 17  Temp: 36.8 C 36.8 C  SpO2: 100% 100%    Last Pain:  Vitals:   11/05/19 0555  TempSrc:   PainSc: 5    Pain Goal: Patients Stated Pain Goal: 0 (11/04/19 1531)                 Laban Emperor

## 2019-11-05 NOTE — Progress Notes (Signed)
Post Partum Day 1 Subjective: Patient reports feeling well. She is tolerating PO. Ambulating and urinating without difficulty. Lochia minimal. Denies COVID symptoms. Desires to DC today if baby can. Breastfeeding well.   Objective: Blood pressure 116/78, pulse 100, temperature 98.3 F (36.8 C), temperature source Oral, resp. rate 18, last menstrual period 01/22/2019, SpO2 100 %, unknown if currently breastfeeding.  Physical Exam:  General: alert, cooperative and appears stated age 89: appropriate Uterine Fundus: firm Incision: NA DVT Evaluation: No evidence of DVT seen on physical exam.  Recent Labs    11/04/19 1509  HGB 12.2  HCT 36.1    Assessment/Plan: Okay to Discharge later this evening if baby is good to discharge; RN to call L&D first call to place orders AM CBC cancelled due to minimal EBL and normal Hgb on admission Desires POP's to be described on discharge Breastfeeding well Vitals stable  COVID positive: continues to be asymptomatic; she understands she needs to quarantine for 14 days on discharge   LOS: 1 day   Brittany Kim 11/05/2019, 4:50 AM

## 2019-11-05 NOTE — Lactation Note (Signed)
This note was copied from a baby's chart. Lactation Consultation Note  Patient Name: Brittany Kim Today's Date: 11/05/2019 Reason for consult: Initial assessment P2, 12 hour term female infant. Mom hx: close spaced pregnancies, depression/ anxiety and COVID + Mom is active on the [facility] program in [facility] and she has DEBP at home ([facility]). Per mom, she breastfeed her 1st child who is less than  2 years  old for 4 months. Mom feels breastfeeding is going well. Mom latched infant on right breast using the football hold, infant latched with nose and chin touching breast, swallows observed and infant was still breastfeeding after 10 minutes when LC left room. Mom knows to call RN or LC if she needs any assistance with latching infant at breast.  Mom will continue to do STS with infant. Mom will breastfeed infant according hunger cues, 8 to 12 times within 24 hours and not exceed 3 hours without breastfeeding infant. Reviewed Baby & Me book's Breastfeeding Basics.  Mom made aware of O/P services, breastfeeding support groups, community resources, and our phone # for post-discharge questions.  Maternal Data Formula Feeding for Exclusion: No Has patient been taught Hand Expression?: Yes Does the patient have breastfeeding experience prior to this delivery?: Yes  Feeding Feeding Type: Breast Fed  LATCH Score Latch: Grasps breast easily, tongue down, lips flanged, rhythmical sucking.  Audible Swallowing: A few with stimulation  Type of Nipple: Everted at rest and after stimulation  Comfort (Breast/Nipple): Soft / non-tender  Hold (Positioning): Assistance needed to correctly position infant at breast and maintain latch.  LATCH Score: 8  Interventions Interventions: Breast feeding basics reviewed;Breast compression;Adjust position;Assisted with latch;Skin to skin;Support pillows;Breast massage;Position options;Hand express;Expressed milk  Lactation Tools Discussed/Used WIC  Program: Yes   Consult Status Consult Status: Follow-up Date: 11/05/19 Follow-up type: In-patient    Danelle Earthly 11/05/2019, 5:54 AM

## 2019-11-05 NOTE — Discharge Instructions (Signed)

## 2019-11-05 NOTE — Progress Notes (Signed)
CSW received consult for history of anxiety and depression.  CSW spoke with MOB via telephone to offer support and complete assessment due to contact precautions.    CSW introduced self and explained reason for consult to which MOB was understanding. MOB welcoming of phone call and easy to engage throughout assessment. MOB reported a history of anxiety and depression but was unable to recall when she was first diagnosed as it has been a while. Per MOB, it was over three years ago. CSW inquired about if MOB has had any recent symptoms and MOB acknowledged an increase in anxiety at the beginning of her pregnancy but that it has since resolved. MOB denied any history of being on medications or receiving counseling and is not interested in either, at this time. MOB reported she currently feels good and denied any mental health symptoms or concerns. CSW provided education regarding the baby blues period vs. perinatal mood disorders. CSW recommended self-evaluation during the postpartum time period using the New Mom Checklist from Postpartum Progress and encouraged MOB to contact a medical professional if symptoms are noted at any time. MOB denied any current SI, HI or DV and reported having good support from FOB, her mother and FOB's mother.   MOB confirmed having all essential items for infant once discharged and stated infant would be sleeping in a bassinet once home. MOB mentioned previously being active with a program with her first child through their pediatrician that gave out diaper vouchers. MOB reported pediatrician is the [facility]. CSW to reach out to their clinical staff so that they can make contact with family. CSW provided review of Sudden Infant Death Syndrome (SIDS) precautions and safe sleeping habits.    CSW identifies no further need for intervention and no barriers to discharge at this time.  Lear Ng, LCSW [facility] [phone]

## 2019-11-21 DIAGNOSIS — H5213 Myopia, bilateral: Secondary | ICD-10-CM | POA: Diagnosis not present

## 2019-11-21 DIAGNOSIS — H52223 Regular astigmatism, bilateral: Secondary | ICD-10-CM | POA: Diagnosis not present

## 2019-12-01 DIAGNOSIS — H5213 Myopia, bilateral: Secondary | ICD-10-CM | POA: Diagnosis not present

## 2019-12-01 DIAGNOSIS — H52223 Regular astigmatism, bilateral: Secondary | ICD-10-CM | POA: Diagnosis not present

## 2019-12-11 ENCOUNTER — Other Ambulatory Visit: Payer: Self-pay

## 2019-12-11 ENCOUNTER — Ambulatory Visit (INDEPENDENT_AMBULATORY_CARE_PROVIDER_SITE_OTHER): Payer: Medicaid Other | Admitting: Advanced Practice Midwife

## 2019-12-11 ENCOUNTER — Encounter: Payer: Self-pay | Admitting: Advanced Practice Midwife

## 2019-12-11 VITALS — BP 107/69 | HR 82 | Ht 66.0 in | Wt 226.0 lb

## 2019-12-11 DIAGNOSIS — Z3202 Encounter for pregnancy test, result negative: Secondary | ICD-10-CM

## 2019-12-11 DIAGNOSIS — Z1389 Encounter for screening for other disorder: Secondary | ICD-10-CM | POA: Diagnosis not present

## 2019-12-11 MED ORDER — NORETHINDRONE 0.35 MG PO TABS: ORAL_TABLET | ORAL | 11 refills | Status: DC

## 2019-12-11 NOTE — Progress Notes (Signed)
Brittany Kim is a 19 y.o. who presents for a postpartum visit. She is 6 weeks postpartum following a spontaneous vaginal delivery. I have fully reviewed the prenatal and intrapartum course. The delivery was at 40.6 gestational weeks. Anesthesia: epidural. Postpartum course has been uneventful. Baby's course has been uneventful. Baby is feeding by both. Bleeding: no bleeding. Bowel function is a little firm, bleeds w/BMs. Bladder function is normal. Patient is not sexually active. Contraception method is oral progesterone-only contraceptive. Postpartum depression screening: negative.  No current outpatient medications on file.  Review of Systems   Constitutional: Negative for fever and chills Eyes: Negative for visual disturbances Respiratory: Negative for shortness of breath, dyspnea Cardiovascular: Negative for chest pain or palpitations  Gastrointestinal: Negative for vomiting, diarrhea and constipation Genitourinary: Negative for dysuria and urgency Musculoskeletal: Negative for back pain, joint pain, myalgias  Neurological: Negative for dizziness and headaches    Objective:     Vitals:   12/11/19 1429  BP: 107/69  Pulse: 82   General:  alert, cooperative and no distress   Breasts:  negative  Lungs: Normal respiratory effort  Heart:  regular rate and rhythm  Abdomen: Soft, nontender   Vulva:  normal  Vagina: normal vagina  Cervix:  closed  Corpus: Well involuted     Rectal Exam: No visible  hemorrhoids        Assessment:    normal postpartum exam.  Plan:   1. Contraception: POPs  3.  Preparation H, stool softeners 2. Follow up as needed.

## 2020-03-07 DIAGNOSIS — Z23 Encounter for immunization: Secondary | ICD-10-CM | POA: Diagnosis not present

## 2020-03-11 ENCOUNTER — Ambulatory Visit (INDEPENDENT_AMBULATORY_CARE_PROVIDER_SITE_OTHER): Payer: Medicaid Other | Admitting: Women's Health

## 2020-03-11 ENCOUNTER — Encounter: Payer: Self-pay | Admitting: Women's Health

## 2020-03-11 VITALS — BP 116/82 | HR 85 | Ht 66.0 in | Wt 222.0 lb

## 2020-03-11 DIAGNOSIS — L659 Nonscarring hair loss, unspecified: Secondary | ICD-10-CM | POA: Diagnosis not present

## 2020-03-11 DIAGNOSIS — Z30011 Encounter for initial prescription of contraceptive pills: Secondary | ICD-10-CM | POA: Diagnosis not present

## 2020-03-11 MED ORDER — LO LOESTRIN FE 1 MG-10 MCG / 10 MCG PO TABS: 1.0000 | ORAL_TABLET | Freq: Every day | ORAL | 3 refills | Status: DC

## 2020-03-11 NOTE — Patient Instructions (Signed)
Oral Contraception Use Oral contraceptive pills (OCPs) are medicines that you take to prevent pregnancy. OCPs work by:  Preventing the ovaries from releasing eggs.  Thickening mucus in the lower part of the uterus (cervix), which prevents sperm from entering the uterus.  Thinning the lining of the uterus (endometrium), which prevents a fertilized egg from attaching to the endometrium. OCPs are highly effective when taken exactly as prescribed. However, OCPs do not prevent sexually transmitted infections (STIs). Safe sex practices, such as using condoms while on an OCP, can help prevent STIs. Before taking OCPs, you may have a physical exam, blood test, and Pap test. A Pap test involves taking a sample of cells from your cervix to check for cancer. Discuss with your health care provider the possible side effects of the OCP you may be prescribed. When you start an OCP, be aware that it can take 2-3 months for your body to adjust to changes in hormone levels. How to take oral contraceptive pills Follow instructions from your health care provider about how to start taking your first cycle of OCPs. Your health care provider may recommend that you:  Start the pill on day 1 of your menstrual period. If you start at this time, you will not need any backup form of birth control (contraception), such as condoms.  Start the pill on the first Sunday after your menstrual period or on the day you get your prescription. In these cases, you will need to use backup contraception for the first week.  Start the pill at any time of your cycle. ? If you take the pill within 5 days of the start of your period, you will not need a backup form of contraception. ? If you start at any other time of your menstrual cycle, you will need to use another form of contraception for 7 days. If your OCP is the type called a minipill, it will protect you from pregnancy after taking it for 2 days (48 hours), and you can stop using  backup contraception after that time. After you have started taking OCPs:  If you forget to take 1 pill, take it as soon as you remember. Take the next pill at the regular time.  If you miss 2 or more pills, call your health care provider. Different pills have different instructions for missed doses. Use backup birth control until your next menstrual period starts.  If you use a 28-day pack that contains inactive pills and you miss 1 of the last 7 pills (pills with no hormones), throw away the rest of the non-hormone pills and start a new pill pack. No matter which day you start the OCP, you will always start a new pack on that same day of the week. Have an extra pack of OCPs and a backup contraceptive method available in case you miss some pills or lose your OCP pack. Follow these instructions at home:  Do not use any products that contain nicotine or tobacco, such as cigarettes and e-cigarettes. If you need help quitting, ask your health care provider.  Always use a condom to protect against STIs. OCPs do not protect against STIs.  Use a calendar to mark the days of your menstrual period.  Read the information and directions that came with your OCP. Talk to your health care provider if you have questions. Contact a health care provider if:  You develop nausea and vomiting.  You have abnormal vaginal discharge or bleeding.  You develop a rash.    You miss your menstrual period. Depending on the type of OCP you are taking, this may be a sign of pregnancy. Ask your health care provider for more information.  You are losing your hair.  You need treatment for mood swings or depression.  You get dizzy when taking the OCP.  You develop acne after taking the OCP.  You become pregnant or think you may be pregnant.  You have diarrhea, constipation, and abdominal pain or cramps.  You miss 2 or more pills. Get help right away if:  You develop chest pain.  You develop shortness of  breath.  You have an uncontrolled or severe headache.  You develop numbness or slurred speech.  You develop visual or speech problems.  You develop pain, redness, and swelling in your legs.  You develop weakness or numbness in your arms or legs. Summary  Oral contraceptive pills (OCPs) are medicines that you take to prevent pregnancy.  OCPs do not prevent sexually transmitted infections (STIs). Always use a condom to protect against STIs.  When you start an OCP, be aware that it can take 2-3 months for your body to adjust to changes in hormone levels.  Read all the information and directions that come with your OCP. This information is not intended to replace advice given to you by your health care provider. Make sure you discuss any questions you have with your health care provider. Document Revised: 01/13/2019 Document Reviewed: 11/02/2016 Elsevier Patient Education  2020 Elsevier Inc.  
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
## 2020-03-12 LAB — TSH: TSH: 1.66 u[IU]/mL (ref 0.450–4.500)

## 2020-06-11 ENCOUNTER — Other Ambulatory Visit: Payer: Medicaid Other | Admitting: Women's Health

## 2020-06-17 ENCOUNTER — Other Ambulatory Visit: Payer: Medicaid Other | Admitting: Women's Health

## 2020-07-01 ENCOUNTER — Other Ambulatory Visit: Payer: Medicaid Other | Admitting: Women's Health

## 2020-07-17 ENCOUNTER — Other Ambulatory Visit: Payer: Medicaid Other | Admitting: Women's Health

## 2020-07-18 MED ORDER — AMMONIA AROMATIC IN INHA
RESPIRATORY_TRACT | Status: AC
  Filled 2020-07-18: qty 10

## 2020-07-24 MED ORDER — MAGNESIUM SULFATE 40 GM/1000ML IV SOLN
INTRAVENOUS | Status: AC
  Filled 2020-07-24: qty 1000

## 2020-08-30 ENCOUNTER — Other Ambulatory Visit: Payer: Self-pay

## 2020-08-30 ENCOUNTER — Encounter (HOSPITAL_COMMUNITY): Payer: Self-pay

## 2020-08-30 ENCOUNTER — Emergency Department (HOSPITAL_COMMUNITY)
Admission: EM | Admit: 2020-08-30 | Discharge: 2020-08-30 | Disposition: A | Discharge status: Home / Self Care | Payer: Medicaid Other | Attending: Emergency Medicine | Admitting: Emergency Medicine

## 2020-08-30 DIAGNOSIS — Z8616 Personal history of COVID-19: Secondary | ICD-10-CM | POA: Insufficient documentation

## 2020-08-30 DIAGNOSIS — S3992XA Unspecified injury of lower back, initial encounter: Secondary | ICD-10-CM | POA: Diagnosis present

## 2020-08-30 DIAGNOSIS — S39012A Strain of muscle, fascia and tendon of lower back, initial encounter: Secondary | ICD-10-CM | POA: Insufficient documentation

## 2020-08-30 DIAGNOSIS — Y92410 Unspecified street and highway as the place of occurrence of the external cause: Secondary | ICD-10-CM | POA: Insufficient documentation

## 2020-08-30 DIAGNOSIS — R519 Headache, unspecified: Secondary | ICD-10-CM | POA: Insufficient documentation

## 2020-08-30 MED ORDER — IBUPROFEN 800 MG PO TABS
800.0000 mg | ORAL_TABLET | Freq: Once | ORAL | Status: AC
  Administered 2020-08-30: 800 mg via ORAL
  Filled 2020-08-30: qty 1

## 2020-08-30 MED ORDER — IBUPROFEN 800 MG PO TABS: 800.0000 mg | ORAL_TABLET | Freq: Three times a day (TID) | ORAL | 0 refills | Status: DC

## 2020-08-30 MED ORDER — METHOCARBAMOL 500 MG PO TABS: 500.0000 mg | ORAL_TABLET | Freq: Three times a day (TID) | ORAL | 0 refills | Status: DC

## 2020-08-30 MED ORDER — METHOCARBAMOL 500 MG PO TABS
500.0000 mg | ORAL_TABLET | Freq: Once | ORAL | Status: AC
  Administered 2020-08-30: 500 mg via ORAL
  Filled 2020-08-30: qty 1
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
## 2020-09-02 ENCOUNTER — Telehealth: Payer: Self-pay

## 2020-09-02 NOTE — Telephone Encounter (Signed)
Transition Care Management Follow-up Telephone Call  Date of discharge and from where: 08/30/2020 [facility]  How have you been since you were released from the hospital? Doing okay.   Any questions or concerns? No  Items Reviewed:  Did the pt receive and understand the discharge instructions provided? Yes   Medications obtained and verified? Yes   Other? No   Any new allergies since your discharge? No   Dietary orders reviewed? No  Do you have support at home? Yes   Home Care and Equipment/Supplies: Were home health services ordered? not applicable If so, what is the name of the agency?na  Has the agency set up a time to come to the patient's home? not applicable Were any new equipment or medical supplies ordered?  No What is the name of the medical supply agency? na Were you able to get the supplies/equipment? no Do you have any questions related to the use of the equipment or supplies? No  Functional Questionnaire: (I = Independent and D = Dependent) ADLs: I  Bathing/Dressing- I  Meal Prep- I  Eating- I  Maintaining continence- I  Transferring/Ambulation- I  Managing Meds- I  Follow up appointments reviewed:   PCP Hospital f/u appt confirmed? Yes  Scheduled to see [facility] on 12/9 @ 1:30pm.  Specialist Hospital f/u appt confirmed? No    Are transportation arrangements needed? No   If their condition worsens, is the pt aware to call PCP or go to the Emergency Dept.? Yes  Was the patient provided with contact information for the PCP's office or ED? Yes  Was to pt encouraged to call back with questions or concerns? Yes

## 2020-09-12 ENCOUNTER — Ambulatory Visit: Payer: Medicaid Other

## 2020-10-15 ENCOUNTER — Other Ambulatory Visit: Payer: Self-pay

## 2020-10-15 ENCOUNTER — Ambulatory Visit: Payer: Medicaid Other | Attending: Nurse Practitioner | Admitting: Nurse Practitioner

## 2021-05-21 ENCOUNTER — Other Ambulatory Visit: Payer: Medicaid Other | Admitting: Women's Health

## 2021-08-15 DIAGNOSIS — M791 Myalgia, unspecified site: Secondary | ICD-10-CM | POA: Diagnosis not present

## 2021-08-15 DIAGNOSIS — Z20822 Contact with and (suspected) exposure to covid-19: Secondary | ICD-10-CM | POA: Diagnosis not present

## 2021-08-15 DIAGNOSIS — J209 Acute bronchitis, unspecified: Secondary | ICD-10-CM | POA: Diagnosis not present

## 2022-01-26 ENCOUNTER — Encounter: Payer: Medicaid Other | Admitting: Obstetrics & Gynecology

## 2022-02-02 ENCOUNTER — Other Ambulatory Visit (HOSPITAL_COMMUNITY)
Admission: RE | Admit: 2022-02-02 | Discharge: 2022-02-02 | Disposition: A | Discharge status: Home / Self Care | Payer: Medicaid Other | Source: Ambulatory Visit | Attending: Advanced Practice Midwife | Admitting: Advanced Practice Midwife

## 2022-02-02 ENCOUNTER — Ambulatory Visit (INDEPENDENT_AMBULATORY_CARE_PROVIDER_SITE_OTHER): Payer: Medicaid Other | Admitting: Advanced Practice Midwife

## 2022-02-02 ENCOUNTER — Encounter: Payer: Self-pay | Admitting: Advanced Practice Midwife

## 2022-02-02 VITALS — BP 119/79 | HR 83 | Ht 68.0 in | Wt 212.0 lb

## 2022-02-02 DIAGNOSIS — Z30430 Encounter for insertion of intrauterine contraceptive device: Secondary | ICD-10-CM | POA: Diagnosis not present

## 2022-02-02 DIAGNOSIS — Z113 Encounter for screening for infections with a predominantly sexual mode of transmission: Secondary | ICD-10-CM | POA: Diagnosis not present

## 2022-02-02 DIAGNOSIS — Z124 Encounter for screening for malignant neoplasm of cervix: Secondary | ICD-10-CM

## 2022-02-02 DIAGNOSIS — Z803 Family history of malignant neoplasm of breast: Secondary | ICD-10-CM | POA: Diagnosis not present

## 2022-02-02 DIAGNOSIS — Z975 Presence of (intrauterine) contraceptive device: Secondary | ICD-10-CM | POA: Diagnosis not present

## 2022-02-02 DIAGNOSIS — Z3009 Encounter for other general counseling and advice on contraception: Secondary | ICD-10-CM

## 2022-02-02 LAB — POCT URINE PREGNANCY: Preg Test, Ur: NEGATIVE

## 2022-02-02 MED ORDER — LEVONORGESTREL 20.1 MCG/DAY IU IUD
1.0000 | INTRAUTERINE_SYSTEM | Freq: Once | INTRAUTERINE | Status: AC
  Administered 2022-02-02: 1 via INTRAUTERINE

## 2022-02-02 NOTE — Progress Notes (Addendum)
21 y.o GYN presents for [facility] Consult and AEX/PAP/STD screening.  Pt does not use any BC and last unprotected sex was 1 week ago. ? ?UPT today is Negative ? ?Administrations This Visit   ? ? levonorgestrel (LILETTA) 20.1 MCG/DAY IUD 1 each   ? ? Admin Date ?02/02/2022 Action ?Given Dose ?1 each Route ?Intrauterine Administered By ?Maretta Bees, RMA  ? ?  ?  ? ?  ?  ? ?

## 2022-02-02 NOTE — Progress Notes (Signed)
? ?Subjective:  ?  ? Brittany Kim is a 21 y.o. female here at [facility] for a routine exam.  Current complaints: none.  Personal health questionnaire reviewed: yes. ? ?Do you have a primary care provider? no ?Do you feel safe at home? yes ? ?Flowsheet Row Initial Prenatal from 04/24/2019 in [facility]  ?PHQ-2 Total Score 1  ? ?  ? ? ?Health Maintenance Due  ?Topic Date Due  ? COVID-19 Vaccine (1) Never done  ? HPV VACCINES (1 - 2-dose series) Never done  ? Hepatitis C Screening  Never done  ? PAP-Cervical Cytology Screening  Never done  ? PAP SMEAR-Modifier  Never done  ? CHLAMYDIA SCREENING  10/10/2020  ?  ? ?Risk factors for chronic health problems: ?Smoking: ?Alchohol/how much: ?Pt BMI: There is no height or weight on file to calculate BMI. ?  ?Gynecologic History ?No LMP recorded. (Menstrual status: Oral contraceptives). ?Contraception: none ?Last Pap: n/a.  ?Last mammogram: n/a .  ? ?Obstetric History ?OB History  ?Gravida Para Term Preterm AB Living  ?2 2 2 0 0 2  ?SAB IAB Ectopic Multiple Live Births  ?0 0 0 0 2  ?  ?# Outcome Date GA Lbr Len/2nd Weight Sex Delivery Anes PTL Lv  ?2 Term 11/04/19 [redacted]w[redacted]d 09:45 / 00:05 8 lb 13.6 oz (4.014 kg) F Vag-Spont EPI  LIV  ?   Birth Comments: n/a  ?1 Term 03/17/18 [redacted]w[redacted]d 02:42 / 02:29 7 lb 7.9 oz (3.4 kg) F Vag-Spont EPI  LIV  ? ? ? ?The following portions of the patient's history were reviewed and updated as appropriate: allergies, current medications, past family history, past medical history, past social history, past surgical history, and problem list. ? ?Review of Systems ?Pertinent items noted in HPI and remainder of comprehensive ROS otherwise negative.  ?  ?Objective:  ? ?There were no vitals taken for this visit. ?VS reviewed, nursing note reviewed,  ?Constitutional: well developed, well nourished, no distress ?HEENT: normocephalic ?CV: normal rate ?HEART: RRR, no murmurs rubs/gallops ?RESP: clear and equal to auscultation bilaterally in all  lobes  ?Breast Exam: With shared decision making, exam performed: right breast normal without mass, skin or nipple changes or axillary nodes, left breast normal without mass, skin or nipple changes or axillary nodes ?Abdomen: soft ?Neuro: alert and oriented x 3 ?Skin: warm, dry ?Psych: affect normal ?Pelvic exam: Performed: Cervix pink, visually closed, without lesion, scant white creamy discharge, vaginal walls and external genitalia normal ?Bimanual exam: Cervix 0/long/high, firm, anterior, neg CMT, uterus nontender, nonenlarged, adnexa without tenderness, enlargement, or mass  ? ? ?IUD Procedure Note ?Patient identified, informed consent performed.  Discussed risks of irregular bleeding, cramping, infection, malpositioning or misplacement of the IUD outside the uterus which may require further procedures. Time out was performed.  Urine pregnancy test negative. ? ?Speculum placed in the vagina.  Cervix visualized.  Cleaned with Betadine x 2.  Grasped anteriorly with a single tooth tenaculum.  Uterus sounded to 8 cm.  Liletta IUD placed per manufacturer's recommendations.  Strings trimmed to 3-4 cm. Tenaculum was removed, good hemostasis noted.  Patient tolerated procedure well.  ? ?Patient was given post-procedure instructions and the Liletta care card with expiration date.  Patient was also asked to check IUD strings periodically and follow up in 4-6 weeks for IUD check.  ?   ?Assessment/Plan:  ? ?1. Routine screening for STI (sexually transmitted infection) ?- Hepatitis C Antibody ?- RPR ?- HIV antibody (with   reflex) ?- Cervicovaginal ancillary only( [place]) ? ?2. Encounter for counseling regarding contraception ?--Discussed pt contraceptive plans and reviewed contraceptive methods based on pt preferences and effectiveness.  Pt prefers IUD today.  Had mood changes with IUD in 2019, but different relationship, other possible causes.  Pt to notify office if any mood changes or other side effects of  IUD. ?--Last intercourse 1 week ago, UPT negative, pt currently having menses so low risks of pregnancy ?--Discussed these risks with pt, and risks of IUD including pregnancy loss and complications with ongoing pregnancy.  Pt states understanding and desires IUD with low risks of pregnancy.  ? ? ?3. Screening for cervical cancer ? ?- Cytology - PAP( [place]) ? ?4. Encounter for IUD insertion ?--Liletta IUD inserted without difficulty, see procedure note ? ?5. Family history of breast cancer ?--Pt paternal grandmother, great grandmother, and aunts ?--Discussed genetic screening for BRCA, pt desires testing ?--Pt filled out info for [employer] and kit given in office today ? ? ?No follow-ups on file.  ? ?Fatima Blank, CNM ?8:32 AM   ?

## 2022-02-03 LAB — CERVICOVAGINAL ANCILLARY ONLY
Chlamydia: NEGATIVE
Comment: NEGATIVE
Comment: NEGATIVE
Comment: NORMAL
Neisseria Gonorrhea: NEGATIVE
Trichomonas: NEGATIVE

## 2022-02-03 LAB — CYTOLOGY - PAP: Diagnosis: NEGATIVE

## 2022-02-03 LAB — HIV ANTIBODY (ROUTINE TESTING W REFLEX): HIV Screen 4th Generation wRfx: NONREACTIVE

## 2022-02-03 LAB — HEPATITIS C ANTIBODY: Hep C Virus Ab: NONREACTIVE

## 2022-02-03 LAB — RPR: RPR Ser Ql: NONREACTIVE

## 2022-02-17 ENCOUNTER — Encounter: Payer: Self-pay | Admitting: Advanced Practice Midwife

## 2022-03-10 ENCOUNTER — Ambulatory Visit: Payer: Medicaid Other | Admitting: Advanced Practice Midwife

## 2022-09-23 ENCOUNTER — Encounter (HOSPITAL_COMMUNITY): Payer: Self-pay

## 2022-09-23 ENCOUNTER — Ambulatory Visit (HOSPITAL_COMMUNITY)
Admission: EM | Admit: 2022-09-23 | Discharge: 2022-09-23 | Disposition: A | Discharge status: Home / Self Care | Payer: Medicaid Other | Attending: Physician Assistant | Admitting: Physician Assistant

## 2022-09-23 DIAGNOSIS — J020 Streptococcal pharyngitis: Secondary | ICD-10-CM

## 2022-09-23 LAB — POCT RAPID STREP A, ED / UC: Streptococcus, Group A Screen (Direct): POSITIVE — AB

## 2022-09-23 MED ORDER — AMOXICILLIN 500 MG PO CAPS
500.0000 mg | ORAL_CAPSULE | Freq: Two times a day (BID) | ORAL | 0 refills | Status: DC
Start: 2022-09-23 — End: 2024-01-20
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
## 2022-09-23 NOTE — ED Triage Notes (Signed)
Pt had a cough but, went away, pt now has a sore throat x today, headache x few days

## 2022-09-23 NOTE — Discharge Instructions (Signed)
You tested positive for strep.  Start amoxicillin 500 mg twice daily for 10 days.  Gargle with warm salt water and alternate Tylenol ibuprofen for pain.  Antibiotics can decrease the effectiveness of your birth control so please use backup birth control on this medication for several days after completing course.  Throw your toothbrush a few days after starting medication to prevent reinfection.  You are contagious for 24 hours after starting medicine.  If you have any worsening or changing symptoms including shortness of breath, swelling of your throat, fever, nausea/vomiting interfere with oral intake, muffled voice you need to be seen immediately.

## 2022-10-26 ENCOUNTER — Ambulatory Visit: Payer: Medicaid Other | Admitting: Advanced Practice Midwife

## 2022-10-26 ENCOUNTER — Encounter: Payer: Self-pay | Admitting: Advanced Practice Midwife

## 2022-10-26 VITALS — BP 130/87 | HR 98 | Ht 67.0 in | Wt 215.0 lb

## 2022-10-26 DIAGNOSIS — F418 Other specified anxiety disorders: Secondary | ICD-10-CM

## 2022-10-26 DIAGNOSIS — Z30431 Encounter for routine checking of intrauterine contraceptive device: Secondary | ICD-10-CM

## 2022-10-26 NOTE — Progress Notes (Signed)
   GYNECOLOGY PROGRESS NOTE  History:  22 y.o. [id number] presents to [place] office today for problem gyn visit. She  wants to check on her IUD to make sure the string is there. She reports no problems with the IUD. She reports that she and her partner could feel the previous IUD but she does not even know this one is there which worries her.  She reports some irritability/mood changes and is unsure if this is related to the IUD. She has hx of anxiety/depression but is not currently being treated for these.     The following portions of the patient's history were reviewed and updated as appropriate: allergies, current medications, past family history, past medical history, past social history, past surgical history and problem list. Last pap smear on 02/02/22 was normal.  Health Maintenance Due  Topic Date Due   COVID-19 Vaccine (1) Never done   HPV VACCINES (1 - 2-dose series) Never done   INFLUENZA VACCINE  05/05/2022     Review of Systems:  Pertinent items are noted in HPI.   Objective:  Physical Exam Blood pressure 130/87, pulse 98, height 5\' 7"  (1.702 m), weight 215 lb (97.5 kg), last menstrual period 10/19/2022. VS reviewed, nursing note reviewed,  Constitutional: well developed, well nourished, no distress HEENT: normocephalic CV: normal rate Pulm/chest wall: normal effort Breast Exam: deferred Abdomen: soft Neuro: alert and oriented x 3 Skin: warm, dry Psych: affect normal Pelvic exam: Cervix pink, visually closed, without lesion, IUD string visible, ~ 3 cm in length from cervical os, scant white creamy discharge, vaginal walls and external genitalia normal   Assessment & Plan:  1. IUD check up --Liletta IUD with normal strings on exam --No physical problems with IUD, see [place] below  2. Depression with anxiety --Pt had more mood changes with previous IUD, and reports this has been better but there may still be some effects.   --Pt would like to talk with someone, she  does not desire IUD removal at this time related to mood.  - Ambulatory referral to [facility]  Return for annual exam.   Fatima Blank, CNM 2:15 PM

## 2022-10-26 NOTE — Progress Notes (Addendum)
Patient presents for IUD check. Denies any abnormal bleeding or pain. Pt states she hasn't felt the string and would like to check placement. Pt states she feels like the IUD "makes her mean". No other concerns at this time.

## 2022-11-02 ENCOUNTER — Ambulatory Visit (INDEPENDENT_AMBULATORY_CARE_PROVIDER_SITE_OTHER): Payer: Medicaid Other | Admitting: Licensed Clinical Social Worker

## 2022-11-02 DIAGNOSIS — F419 Anxiety disorder, unspecified: Secondary | ICD-10-CM

## 2022-11-03 NOTE — BH Specialist Note (Signed)
Integrated Behavioral Health Initial In-Person Visit  MRN: [id number] Name: Brittany Kim  Number of [place] Clinician visits: 1 Session Start time:   1:00pm Session End time: 1:27pm Total time in minutes: 27 mins in person at [facility]   Types of Service: [facility] (BHI)  Interpretor:No. Interpretor Name and Language: none   Warm Hand Off Completed.        Subjective: Brittany Kim is a 22 y.o. female accompanied by n/a Patient was referred by L Leftwich-Kirby CNM for . Patient reports the following symptoms/concerns: depression Duration of problem: over two years ; Severity of problem: mild  Objective: Mood: good and Affect: Appropriate Risk of harm to self or others: No plan to harm self or others  Life Context: Family and Social: Lives with fiances in [place]  School/Work: Not mentioned  Self-Care: Not mentioned  Life Changes: Recent custody case   Patient and/or Family's Strengths/Protective Factors: Concrete supports in place (healthy food, safe environments, etc.)  Goals Addressed: Patient will: Reduce symptoms of: anxiety Increase knowledge and/or ability of: coping skills  Demonstrate ability to: Increase healthy adjustment to current life circumstances  Progress towards Goals: Ongoing  Interventions: Interventions utilized: Supportive Counseling and Link to [employer]  Standardized Assessments completed: Not Needed  Patient and/or Family Response: Ms. Trew reports increase anxiety and has concerns symptoms are directly related to IUD. Ms. Rossa reports feeling angry, and difficulty expressing her emotions and worry.   Assessment: Patient currently experiencing anxiety.   Patient may benefit from community mental health.  Plan: Follow up with behavioral health clinician on : as needed  Behavioral recommendations: Contact [facility] referral, journal writing to document emotions, discuss  birth control option with medical provider Referral(s): Outpatient behavioral health "From scale of 1-10, how likely are you to follow plan?":    Lynnea Ferrier, LCSW

## 2022-12-08 ENCOUNTER — Encounter (HOSPITAL_BASED_OUTPATIENT_CLINIC_OR_DEPARTMENT_OTHER): Payer: Self-pay | Admitting: Emergency Medicine

## 2022-12-08 ENCOUNTER — Emergency Department (HOSPITAL_BASED_OUTPATIENT_CLINIC_OR_DEPARTMENT_OTHER)
Admission: EM | Admit: 2022-12-08 | Discharge: 2022-12-08 | Disposition: A | Discharge status: Home / Self Care | Payer: Medicaid Other | Attending: Emergency Medicine | Admitting: Emergency Medicine

## 2022-12-08 ENCOUNTER — Other Ambulatory Visit: Payer: Self-pay

## 2022-12-08 DIAGNOSIS — E876 Hypokalemia: Secondary | ICD-10-CM

## 2022-12-08 DIAGNOSIS — R112 Nausea with vomiting, unspecified: Secondary | ICD-10-CM | POA: Insufficient documentation

## 2022-12-08 DIAGNOSIS — R319 Hematuria, unspecified: Secondary | ICD-10-CM | POA: Diagnosis not present

## 2022-12-08 DIAGNOSIS — E860 Dehydration: Secondary | ICD-10-CM | POA: Diagnosis not present

## 2022-12-08 DIAGNOSIS — R197 Diarrhea, unspecified: Secondary | ICD-10-CM | POA: Diagnosis not present

## 2022-12-08 DIAGNOSIS — R1110 Vomiting, unspecified: Secondary | ICD-10-CM | POA: Diagnosis not present

## 2022-12-08 DIAGNOSIS — R109 Unspecified abdominal pain: Secondary | ICD-10-CM | POA: Diagnosis not present

## 2022-12-08 DIAGNOSIS — Z3202 Encounter for pregnancy test, result negative: Secondary | ICD-10-CM | POA: Diagnosis not present

## 2022-12-08 LAB — COMPREHENSIVE METABOLIC PANEL
ALT: 17 U/L (ref 0–44)
AST: 26 U/L (ref 15–41)
Albumin: 3.9 g/dL (ref 3.5–5.0)
Alkaline Phosphatase: 45 U/L (ref 38–126)
Anion gap: 7 (ref 5–15)
BUN: 14 mg/dL (ref 6–20)
CO2: 25 mmol/L (ref 22–32)
Calcium: 8.8 mg/dL — ABNORMAL LOW (ref 8.9–10.3)
Chloride: 108 mmol/L (ref 98–111)
Creatinine, Ser: 0.85 mg/dL (ref 0.44–1.00)
GFR, Estimated: >60 mL/min (ref >60)
Glucose, Bld: 73 mg/dL (ref 70–99)
Potassium: 3.1 mmol/L — ABNORMAL LOW (ref 3.5–5.1)
Sodium: 140 mmol/L (ref 135–145)
Total Bilirubin: 0.5 mg/dL (ref 0.3–1.2)
Total Protein: 6.7 g/dL (ref 6.5–8.1)

## 2022-12-08 LAB — LIPASE, BLOOD: Lipase: 23 U/L (ref 11–51)

## 2022-12-08 LAB — CBC
HCT: 38.4 % (ref 36.0–46.0)
Hemoglobin: 14.1 g/dL (ref 12.0–15.0)
MCH: 31.3 pg (ref 26.0–34.0)
MCHC: 36.7 g/dL — ABNORMAL HIGH (ref 30.0–36.0)
MCV: 85.1 fL (ref 80.0–100.0)
Platelets: 178 10*3/uL (ref 150–400)
RBC: 4.51 MIL/uL (ref 3.87–5.11)
RDW: 12.8 % (ref 11.5–15.5)
WBC: 5.9 10*3/uL (ref 4.0–10.5)
nRBC: 0 % (ref 0.0–0.2)

## 2022-12-08 LAB — URINALYSIS, ROUTINE W REFLEX MICROSCOPIC
Bacteria, UA: NONE SEEN
Bilirubin Urine: NEGATIVE
Glucose, UA: NEGATIVE mg/dL
Ketones, ur: 15 mg/dL — AB
Nitrite: NEGATIVE
Specific Gravity, Urine: 1.032 — ABNORMAL HIGH (ref 1.005–1.030)
pH: 6 (ref 5.0–8.0)

## 2022-12-08 LAB — PREGNANCY, URINE: Preg Test, Ur: NEGATIVE

## 2022-12-08 MED ORDER — ONDANSETRON HCL 4 MG/2ML IJ SOLN
4.0000 mg | Freq: Once | INTRAMUSCULAR | Status: DC
  Filled 2022-12-08: qty 2

## 2022-12-08 MED ORDER — SODIUM CHLORIDE 0.9 % IV BOLUS
1000.0000 mL | Freq: Once | INTRAVENOUS | Status: AC
  Administered 2022-12-08: 1000 mL via INTRAVENOUS

## 2022-12-08 MED ORDER — POTASSIUM CHLORIDE CRYS ER 20 MEQ PO TBCR
40.0000 meq | EXTENDED_RELEASE_TABLET | Freq: Once | ORAL | Status: AC
  Administered 2022-12-08: 40 meq via ORAL
  Filled 2022-12-08: qty 2

## 2022-12-08 NOTE — Discharge Instructions (Signed)
[facility] take care of you today.  Your potassium was low likely from the diarrhea you are having, make sure you are drinking enough fluids to avoid dehydration.  Take over-the-counter probiotics, eat foods high in potassium.  Come back to the ER if you have any new or worsening symptoms and follow-up closely with your primary care doctor.

## 2022-12-08 NOTE — ED Triage Notes (Addendum)
Pt reports emesis, diarrhea, and abdominal cramping x 3 days. Denies fevers at home.

## 2022-12-08 NOTE — ED Notes (Signed)
Discharge paperwork given and verbally understood. 

## 2022-12-08 NOTE — ED Provider Notes (Signed)
[facility] Provider Note   CSN: [id number] Arrival date & time: 12/08/22  1752     History {Add pertinent medical, surgical, social history, OB history to HPI:1} Chief Complaint  Patient presents with  . Emesis    Brittany Kim is a 22 y.o. female.  Planing of 4 days of diarrhea.  States 4 days ago she had nausea and vomiting with some diarrhea, the nausea and vomiting have much improved and she is tolerating p.o. fluids now but she still having diarrhea frequently throughout the day.  Denies any hematemesis or hematochezia, no chest pain or shortness of breath.  No fevers or chills.  She has been following a brat diet and eating yogurt without relief of the diarrhea and she tried Pepto-Bismol and Imodium without relief Had some tenderness in her abdomen on exam at urgent care and they advised she come to the ED for further evaluation.  No history of abdominal surgeries  Emesis      Home Medications Prior to Admission medications   Medication Sig Start Date End Date Taking? Authorizing Provider  amoxicillin (AMOXIL) 500 MG capsule Take 1 capsule (500 mg total) by mouth 2 (two) times daily. 09/23/22   Raspet, Derry Skill, PA-C  ibuprofen (ADVIL) 800 MG tablet Take 1 tablet (800 mg total) by mouth 3 (three) times daily. Take with food 08/30/20   Triplett, Tammy, PA-C  methocarbamol (ROBAXIN) 500 MG tablet Take 1 tablet (500 mg total) by mouth 3 (three) times daily. 08/30/20   Triplett, Lynelle Smoke, PA-C      Allergies    Patient has no known allergies.    Review of Systems   Review of Systems  Gastrointestinal:  Positive for vomiting.    Physical Exam Updated Vital Signs BP 107/74 (BP Location: Right Arm)   Pulse 77   Temp 98.4 F (36.9 C) (Oral)   Resp 16   SpO2 100%  Physical Exam Vitals and nursing note reviewed.  Constitutional:      General: She is not in acute distress.    Appearance: She is well-developed.  HENT:     Head:  Normocephalic and atraumatic.  Eyes:     Conjunctiva/sclera: Conjunctivae normal.  Cardiovascular:     Rate and Rhythm: Normal rate and regular rhythm.     Heart sounds: No murmur heard. Pulmonary:     Effort: Pulmonary effort is normal. No respiratory distress.     Breath sounds: Normal breath sounds.  Abdominal:     General: There is no distension.     Palpations: Abdomen is soft.     Tenderness: There is no abdominal tenderness. There is no right CVA tenderness, guarding or rebound.  Musculoskeletal:        General: No swelling.     Cervical back: Neck supple.  Skin:    General: Skin is warm and dry.     Capillary Refill: Capillary refill takes less than 2 seconds.  Neurological:     Mental Status: She is alert.  Psychiatric:        Mood and Affect: Mood normal.    ED Results / Procedures / Treatments   Labs (all labs ordered are listed, but only abnormal results are displayed) Labs Reviewed  COMPREHENSIVE METABOLIC PANEL - Abnormal; Notable for the following components:      Result Value   Potassium 3.1 (*)    Calcium 8.8 (*)    All other components within normal limits  CBC -  Abnormal; Notable for the following components:   MCHC 36.7 (*)    All other components within normal limits  URINALYSIS, ROUTINE W REFLEX MICROSCOPIC - Abnormal; Notable for the following components:   Specific Gravity, Urine 1.032 (*)    Hgb urine dipstick LARGE (*)    Ketones, ur 15 (*)    Protein, ur TRACE (*)    Leukocytes,Ua TRACE (*)    All other components within normal limits  LIPASE, BLOOD  PREGNANCY, URINE    EKG None  Radiology No results found.  Procedures Procedures  {Document cardiac monitor, telemetry assessment procedure when appropriate:1}  Medications Ordered in ED Medications  ondansetron (ZOFRAN) injection 4 mg (4 mg Intravenous Patient Refused/Not Given 12/08/22 1924)  sodium chloride 0.9 % bolus 1,000 mL (1,000 mLs Intravenous New Bag/Given 12/08/22 1922)     ED Course/ Medical Decision Making/ A&P   {   Click here for ABCD2, HEART and other calculatorsREFRESH Note before signing :1}                          Medical Decision Making This patient presents to the ED for concern of ***, this involves an extensive number of treatment options, and is a complaint that carries with it a high risk of complications and morbidity.  The differential diagnosis includes ***    Additional history obtained:  Additional history obtained from *** External records from outside source obtained and reviewed including ***   Lab Tests:  I Ordered, and personally interpreted labs.  The pertinent results include:  ***    Imaging Studies ordered:  I ordered imaging studies including ***  I independently visualized and interpreted imaging which showed *** I agree with the radiologist interpretation   Cardiac Monitoring: / EKG:  The patient was maintained on a cardiac monitor.  I personally viewed and interpreted the cardiac monitored which showed an underlying rhythm of: ***   Consultations Obtained:  I requested consultation with the ***,  and discussed lab and imaging findings as well as pertinent plan - they recommend: ***   Problem List / ED Course / Critical interventions / Medication management   Diarrhea-patient given IV fluids, declined nausea medicine stating this is improved and she does not feel she needs it.  I ordered medication including saline  for dehydration  Reevaluation of the patient after these medicines showed that the patient improved I have reviewed the patients home medicines and have made adjustments as needed    Test / Admission - Considered:  CT but patient's abdominal exam is very benign.  Normal vitals and labs.  Shared decision-making was used and discussed with patient risks and benefits of CT.  She states she does not feel she needs CT today.  She was informed on hands and symptoms of necessitate return to the  ED including worsening pain, persistent vomiting, fevers, blood in the stool or any other worrisome changes.    Risk Prescription drug management.   ***  {Document critical care time when appropriate:1} {Document review of labs and clinical decision tools ie heart score, Chads2Vasc2 etc:1}  {Document your independent review of radiology images, and any outside records:1} {Document your discussion with family members, caretakers, and with consultants:1} {Document social determinants of health affecting pt's care:1} {Document your decision making why or why not admission, treatments were needed:1} Final Clinical Impression(s) / ED Diagnoses Final diagnoses:  None    Rx / DC Orders ED Discharge Orders  None       

## 2022-12-08 NOTE — ED Notes (Addendum)
Zofran - Pulled drawn up but disposed of before Pt refused.Marland KitchenMarland Kitchen

## 2022-12-18 DIAGNOSIS — H5213 Myopia, bilateral: Secondary | ICD-10-CM | POA: Diagnosis not present

## 2023-10-18 ENCOUNTER — Ambulatory Visit: Payer: Medicaid Other

## 2023-10-20 ENCOUNTER — Encounter: Payer: Medicaid Other | Admitting: Obstetrics & Gynecology

## 2024-01-20 ENCOUNTER — Encounter: Payer: Self-pay | Admitting: Family Medicine

## 2024-01-20 ENCOUNTER — Ambulatory Visit (INDEPENDENT_AMBULATORY_CARE_PROVIDER_SITE_OTHER): Admitting: Family Medicine

## 2024-01-20 VITALS — BP 122/81 | HR 87 | Temp 97.6°F | Resp 16 | Ht 65.35 in | Wt 230.0 lb

## 2024-01-20 DIAGNOSIS — Z975 Presence of (intrauterine) contraceptive device: Secondary | ICD-10-CM

## 2024-01-20 DIAGNOSIS — K5900 Constipation, unspecified: Secondary | ICD-10-CM

## 2024-01-20 DIAGNOSIS — F411 Generalized anxiety disorder: Secondary | ICD-10-CM | POA: Insufficient documentation

## 2024-01-20 DIAGNOSIS — Z0000 Encounter for general adult medical examination without abnormal findings: Secondary | ICD-10-CM
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
## 2024-01-20 NOTE — Patient Instructions (Addendum)
 You can take a stool softener every day or every other day to help keep your bowel movements regular.   [facility] [name] [address] [place], [place] [address] [phone]    Health Maintenance Recommendations Screening Testing Mammogram Every 1 -2 years based on history and risk factors Starting at age 25 Pap Smear Ages 21-39 every 3 years Ages 33-65 every 5 years with HPV testing More frequent testing may be required based on results and history Colon Cancer Screening Every 1-10 years based on test performed, risk factors, and history Starting at age 65 Bone Density Screening Every 2-10 years based on history Starting at age 69 for women Recommendations for men differ based on medication usage, history, and risk factors AAA Screening One time ultrasound Men 6-5 years old who have every smoked Lung Cancer Screening Low Dose Lung CT every 12 months Age 85-80 years with a 30 pack-year smoking history who still smoke or who have quit within the last 15 years   Screening Labs Routine  Labs: Complete Blood Count (CBC), Complete Metabolic Panel (CMP), Cholesterol (Lipid Panel) Every 6-12 months based on history and medications May be recommended more frequently based on current conditions or previous results Hemoglobin A1c Lab Every 3-12 months based on history and previous results Starting at age 54 or earlier with diagnosis of diabetes, high cholesterol, BMI >26, and/or risk factors Frequent monitoring for patients with diabetes to ensure blood sugar control Thyroid Panel (TSH w/ T3 & T4) Every 6 months based on history, symptoms, and risk factors May be repeated more often if on medication HIV One time testing for all patients 43 and older May be repeated more frequently for patients with increased risk factors or exposure Hepatitis C One time testing for all patients 51 and older May be repeated more frequently for patients with  increased risk factors or exposure Gonorrhea, Chlamydia Every 12 months for all sexually active persons 13-24 years Additional monitoring may be recommended for those who are considered high risk or who have symptoms PSA Men 43-60 years old with risk factors Additional screening may be recommended from age 5-69 based on risk factors, symptoms, and history   Vaccine Recommendations Tetanus Booster All adults every 10 years Flu Vaccine All patients 6 months and older every year COVID Vaccine All patients 12 years and older Initial dosing with booster May recommend additional booster based on age and health history HPV Vaccine 2 doses all patients age 7-26 Dosing may be considered for patients over 26 Shingles Vaccine (Shingrix) 2 doses all adults 55 years and older Pneumonia (Pneumovax 16) All adults 65 years and older May recommend earlier dosing based on health history Pneumonia (Prevnar 38) All adults 65 years and older Dosed 1 year after Pneumovax 23   Additional Screening, Testing, and Vaccinations may be recommended on an individualized basis based on family history, health history, risk factors, and/or exposure.  __________________________________________________________   Diet Recommendations for All Patients   I recommend that all patients maintain a diet low in saturated fats, carbohydrates, and cholesterol. While this can be challenging at first, it is not impossible and small changes can make big differences.  Things to try: Decreasing the amount of soda, sweet tea, and/or juice to one or less per day and replace with water While water is always the first choice, if you do not like water you may consider adding a water additive without sugar to improve the taste other sugar free drinks Replace potatoes  with a brightly colored vegetable at dinner Use healthy oils, such as canola oil or olive oil, instead of butter or hard margarine Limit your bread intake to two  pieces or less a day Replace regular pasta with low carb pasta options Bake, broil, or grill foods instead of frying Monitor portion sizes  Eat smaller, more frequent meals throughout the day instead of large meals   An important thing to remember is, if you love foods that are not great for your health, you don't have to give them up completely. Instead, allow these foods to be a reward when you have done well. Allowing yourself to still have special treats every once in a while is a nice way to tell yourself thank you for working hard to keep yourself healthy.    Also remember that every day is a new day. If you have a bad day and "fall off the wagon", you can still climb right back up and keep moving along on your journey!   We have resources available to help you!  Some websites that may be helpful include: www.http://www.wall-moore.info/        Www.VeryWellFit.com _____________________________________________________________   Activity Recommendations for All Patients   I recommend that all adults get at least 20 minutes of moderate physical activity that elevates your heart rate at least 5 days out of the week.  Some examples include: Walking or jogging at a pace that allows you to carry on a conversation Cycling (stationary bike or outdoors) Water aerobics Yoga Weight lifting Dancing If physical limitations prevent you from putting stress on your joints, exercise in a pool or seated in a chair are excellent options.   Do determine your MAXIMUM heart rate for activity: YOUR AGE - 220 = MAX HeartRate    Remember! Do not push yourself too hard.  Start slowly and build up your pace, speed, weight, time in exercise, etc.  Allow your body to rest between exercise and get good sleep. You will need more water than normal when you are exerting yourself. Do not wait until you are thirsty to drink. Drink with a purpose of getting in at least 8, 8 ounce glasses of water a day plus more depending on how  much you exercise and sweat.      If you begin to develop dizziness, chest pain, abdominal pain, jaw pain, shortness of breath, headache, vision changes, lightheadedness, or other concerning symptoms, stop the activity and allow your body to rest. If your symptoms are severe, seek emergency evaluation immediately. If your symptoms are concerning, but not severe, please let us  know so that we can recommend further evaluation.

## 2024-01-21 ENCOUNTER — Encounter: Payer: Self-pay | Admitting: Family Medicine

## 2024-01-21 LAB — CBC WITH DIFFERENTIAL/PLATELET
Basophils Absolute: 0.1 10*3/uL (ref 0.0–0.2)
Basos: 1 %
EOS (ABSOLUTE): 0.1 10*3/uL (ref 0.0–0.4)
Eos: 2 %
Hematocrit: 41.7 % (ref 34.0–46.6)
Hemoglobin: 14.2 g/dL (ref 11.1–15.9)
Immature Grans (Abs): 0 10*3/uL (ref 0.0–0.1)
Immature Granulocytes: 0 %
Lymphocytes Absolute: 2.8 10*3/uL (ref 0.7–3.1)
Lymphs: 37 %
MCH: 31.3 pg (ref 26.6–33.0)
MCHC: 34.1 g/dL (ref 31.5–35.7)
MCV: 92 fL (ref 79–97)
Monocytes Absolute: 0.4 10*3/uL (ref 0.1–0.9)
Monocytes: 5 %
Neutrophils Absolute: 4.2 10*3/uL (ref 1.4–7.0)
Neutrophils: 55 %
Platelets: 191 10*3/uL (ref 150–450)
RBC: 4.54 x10E6/uL (ref 3.77–5.28)
RDW: 13 % (ref 11.7–15.4)
WBC: 7.5 10*3/uL (ref 3.4–10.8)

## 2024-01-21 LAB — LIPID PANEL
Chol/HDL Ratio: 2.9 ratio (ref 0.0–4.4)
Cholesterol, Total: 139 mg/dL (ref 100–199)
HDL: 48 mg/dL (ref >39)
LDL Chol Calc (NIH): 71 mg/dL (ref 0–99)
Triglycerides: 113 mg/dL (ref 0–149)
VLDL Cholesterol Cal: 20 mg/dL (ref 5–40)

## 2024-01-21 LAB — HEMOGLOBIN A1C
Est. average glucose Bld gHb Est-mCnc: 88 mg/dL
Hgb A1c MFr Bld: 4.7 % — ABNORMAL LOW (ref 4.8–5.6)

## 2024-01-21 LAB — COMPREHENSIVE METABOLIC PANEL WITH GFR
ALT: 13 IU/L (ref 0–32)
AST: 19 IU/L (ref 0–40)
Albumin: 4.4 g/dL (ref 4.0–5.0)
Alkaline Phosphatase: 73 IU/L (ref 44–121)
BUN/Creatinine Ratio: 18 (ref 9–23)
BUN: 15 mg/dL (ref 6–20)
Bilirubin Total: 0.4 mg/dL (ref 0.0–1.2)
CO2: 24 mmol/L (ref 20–29)
Calcium: 9.2 mg/dL (ref 8.7–10.2)
Chloride: 103 mmol/L (ref 96–106)
Creatinine, Ser: 0.83 mg/dL (ref 0.57–1.00)
Globulin, Total: 2.1 g/dL (ref 1.5–4.5)
Glucose: 69 mg/dL — ABNORMAL LOW (ref 70–99)
Potassium: 4.2 mmol/L (ref 3.5–5.2)
Sodium: 140 mmol/L (ref 134–144)
Total Protein: 6.5 g/dL (ref 6.0–8.5)
eGFR: 100 mL/min/{1.73_m2} (ref >59)

## 2024-01-21 LAB — TSH RFX ON ABNORMAL TO FREE T4: TSH: 1.73 u[IU]/mL (ref 0.450–4.500)

## 2024-02-02 ENCOUNTER — Ambulatory Visit: Admitting: Obstetrics and Gynecology

## 2024-02-23 ENCOUNTER — Ambulatory Visit: Admitting: Obstetrics and Gynecology

## 2024-02-23 ENCOUNTER — Encounter: Payer: Self-pay | Admitting: Obstetrics and Gynecology

## 2024-02-23 VITALS — BP 108/80 | HR 93 | Ht 67.0 in | Wt 231.0 lb

## 2024-02-23 DIAGNOSIS — Z30432 Encounter for removal of intrauterine contraceptive device: Secondary | ICD-10-CM

## 2024-02-23 MED ORDER — PRENATAL 28-0.8 MG PO TABS: 1.0000 | ORAL_TABLET | Freq: Every day | ORAL | 12 refills | Status: DC

## 2024-02-23 NOTE — Progress Notes (Signed)
 Has IUD Liletta , placed in 2023.  Here to have IUD removed. Just got married and wanting to have a baby.

## 2024-02-23 NOTE — Progress Notes (Signed)
    GYNECOLOGY OFFICE PROCEDURE NOTE  Brittany Kim is a 23 y.o. [phone] here for Liletta  IUD removal. No GYN concerns.  Last pap smear was on 02/02/2022 and was normal.  IUD Removal  Brittany Kim identified, informed consent performed, consent signed.  Brittany Kim was in the dorsal lithotomy position, normal external genitalia was noted.  A speculum was placed in the Brittany Kim's vagina, normal discharge was noted, no lesions. The cervix was visualized, no lesions, no abnormal discharge.  The strings of the IUD were grasped and pulled using ring forceps. Brittany Kim tolerated the procedure well.    Brittany Kim plans for pregnancy soon encouraged to start taking PNV and folic acid.  Routine preventative health maintenance measures emphasized.   Susi Eric, FNP [facility] [employer]
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
## 2024-05-03 ENCOUNTER — Ambulatory Visit (INDEPENDENT_AMBULATORY_CARE_PROVIDER_SITE_OTHER): Payer: Self-pay | Admitting: Psychiatry

## 2024-05-03 ENCOUNTER — Encounter: Payer: Self-pay | Admitting: Psychiatry

## 2024-05-03 DIAGNOSIS — Z79899 Other long term (current) drug therapy: Secondary | ICD-10-CM

## 2024-05-03 DIAGNOSIS — F1090 Alcohol use, unspecified, uncomplicated: Secondary | ICD-10-CM

## 2024-05-03 DIAGNOSIS — F419 Anxiety disorder, unspecified: Secondary | ICD-10-CM

## 2024-05-03 DIAGNOSIS — Z1321 Encounter for screening for nutritional disorder: Secondary | ICD-10-CM

## 2024-05-03 DIAGNOSIS — G4700 Insomnia, unspecified: Secondary | ICD-10-CM

## 2024-05-03 DIAGNOSIS — F4310 Post-traumatic stress disorder, unspecified: Secondary | ICD-10-CM

## 2024-05-03 MED ORDER — SERTRALINE HCL 25 MG PO TABS: 25.0000 mg | ORAL_TABLET | Freq: Every day | ORAL | 1 refills | Status: DC
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
## 2024-06-19 ENCOUNTER — Ambulatory Visit (INDEPENDENT_AMBULATORY_CARE_PROVIDER_SITE_OTHER): Payer: Self-pay | Admitting: Psychiatry

## 2024-06-19 ENCOUNTER — Other Ambulatory Visit: Payer: Self-pay

## 2024-06-19 ENCOUNTER — Encounter: Payer: Self-pay | Admitting: Psychiatry

## 2024-06-19 VITALS — BP 112/78 | HR 94 | Temp 97.7°F | Ht 67.0 in | Wt 234.0 lb

## 2024-06-19 DIAGNOSIS — F419 Anxiety disorder, unspecified: Secondary | ICD-10-CM

## 2024-06-19 DIAGNOSIS — F4310 Post-traumatic stress disorder, unspecified: Secondary | ICD-10-CM

## 2024-06-19 DIAGNOSIS — F1090 Alcohol use, unspecified, uncomplicated: Secondary | ICD-10-CM

## 2024-06-19 DIAGNOSIS — G4700 Insomnia, unspecified: Secondary | ICD-10-CM

## 2024-06-19 MED ORDER — SERTRALINE HCL 50 MG PO TABS: 50.0000 mg | ORAL_TABLET | Freq: Every day | ORAL | 1 refills | Status: DC

## 2024-06-19 NOTE — Patient Instructions (Signed)
 Increase sertraline  50 mg at night  Obtain labs- vitamin D  Next appointment- 11/10 at 11:30

## 2024-07-02 ENCOUNTER — Other Ambulatory Visit: Payer: Self-pay | Admitting: Psychiatry

## 2024-08-13 NOTE — Progress Notes (Unsigned)
 No show

## 2024-08-14 ENCOUNTER — Ambulatory Visit (INDEPENDENT_AMBULATORY_CARE_PROVIDER_SITE_OTHER): Admitting: Psychiatry

## 2024-08-14 DIAGNOSIS — Z91199 Patient's noncompliance with other medical treatment and regimen due to unspecified reason: Secondary | ICD-10-CM

## 2024-08-29 ENCOUNTER — Telehealth: Payer: Self-pay | Admitting: Psychiatry

## 2024-08-29 ENCOUNTER — Other Ambulatory Visit: Payer: Self-pay | Admitting: Psychiatry

## 2024-08-29 MED ORDER — SERTRALINE HCL 50 MG PO TABS: 50.0000 mg | ORAL_TABLET | Freq: Every day | ORAL | 0 refills | Status: DC

## 2024-08-29 NOTE — Telephone Encounter (Signed)
 Patient forgot about her appointment 08-14-24, now she needs a refill on medication. She is scheduled for in person on 09-14-24. Will you please refill her medication?

## 2024-08-29 NOTE — Telephone Encounter (Signed)
 Medication is ordered to the pharmacy.
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
## 2024-09-14 ENCOUNTER — Telehealth (INDEPENDENT_AMBULATORY_CARE_PROVIDER_SITE_OTHER): Admitting: Psychiatry

## 2024-09-14 ENCOUNTER — Encounter: Payer: Self-pay | Admitting: Psychiatry

## 2024-09-14 DIAGNOSIS — F4310 Post-traumatic stress disorder, unspecified: Secondary | ICD-10-CM

## 2024-09-14 DIAGNOSIS — F419 Anxiety disorder, unspecified: Secondary | ICD-10-CM | POA: Diagnosis not present

## 2024-09-14 DIAGNOSIS — F1090 Alcohol use, unspecified, uncomplicated: Secondary | ICD-10-CM

## 2024-09-14 DIAGNOSIS — G4700 Insomnia, unspecified: Secondary | ICD-10-CM

## 2024-09-14 MED ORDER — SERTRALINE HCL 100 MG PO TABS: 100.0000 mg | ORAL_TABLET | Freq: Every day | ORAL | 1 refills | Status: DC

## 2024-10-01 ENCOUNTER — Ambulatory Visit: Payer: Self-pay

## 2024-10-01 ENCOUNTER — Other Ambulatory Visit: Payer: Self-pay

## 2024-10-01 ENCOUNTER — Inpatient Hospital Stay (HOSPITAL_COMMUNITY)
Admission: AD | Admit: 2024-10-01 | Discharge: 2024-10-01 | Disposition: A | Discharge status: Home / Self Care | Payer: Self-pay | Attending: Obstetrics & Gynecology | Admitting: Obstetrics & Gynecology

## 2024-10-01 DIAGNOSIS — Z3202 Encounter for pregnancy test, result negative: Secondary | ICD-10-CM

## 2024-10-01 LAB — POCT PREGNANCY, URINE: Preg Test, Ur: NEGATIVE

## 2024-10-01 LAB — HCG, QUANTITATIVE, PREGNANCY: hCG, Beta Chain, Quant, S: 7 m[IU]/mL — ABNORMAL HIGH

## 2024-10-01 NOTE — Discharge Instructions (Signed)
 You came into the hospital because she had a home pregnancy test on Christmas Eve with subsequent spotting that started yesterday.  We are doing a blood pregnancy test.  We will call you at home with the results.

## 2024-10-01 NOTE — MAU Note (Signed)
 Brittany Kim is a 23 y.o. at Unknown here in MAU reporting: +HPT on christmas eve. States yesterday evening she started bleeding that wasn't heavy and went to bed. States she hasn't had a lot of bleeding today but it is bright red and a few small clots have been present. Denies any lower abdominal pain. Patient denies any unusual vaginal discharge, vaginal odor, itching or pain. Denies any recent sexual intercourse in the last 24-48 hours.   LMP: 08/26/24 Onset of complaint: yesterday  Pain score: 0 Vitals:   10/01/24 1853  BP: 113/73  Pulse: 83  Resp: 16  Temp: 98 F (36.7 C)  SpO2: 99%     FHT:n/a  Lab orders placed from triage:  pregnancy test, bhcg

## 2024-10-01 NOTE — MAU Provider Note (Signed)
"  ° °  S Ms. LOANN CHAHAL is a 23 y.o. [phone] pregnant/non-pregnant female at Unknown who presents to MAU today with complaint of spotting since yesterday, denies other symptoms. Had a home pregnancy test on Christmas eve.    Pertinent items noted in HPI and remainder of comprehensive ROS otherwise negative.   O BP 113/73 (BP Location: Right Arm)   Pulse 83   Temp 98 F (36.7 C) (Oral)   Resp 16   SpO2 99%  Physical Exam Constitutional:      General: She is not in acute distress.    Comments: Tearful throughout conversation  Pulmonary:     Effort: Pulmonary effort is normal.  Neurological:     Mental Status: She is alert.  Psychiatric:        Mood and Affect: Mood normal.        Behavior: Behavior normal.    Results for orders placed or performed during the hospital encounter of 10/01/24 (from the past 24 hours)  Pregnancy, urine POC     Status: None   Collection Time: 10/01/24  6:45 PM  Result Value Ref Range   Preg Test, Ur NEGATIVE NEGATIVE    MDM: MAU Course:  A Negative urine pregnancy test Medical screening exam complete  P Discharge from MAU in stable condition with return precautions Will call at home with hCG quant results  Future Appointments  Date Time Provider Department Center  11/01/2024  2:00 PM Vickey Mettle, MD [facility] None  01/18/2025  8:30 AM [facility] [address]  01/25/2025  2:50 PM Towana Small, FNP [facility] [address]   Allergies as of 10/01/2024   No Known Allergies      Medication List     STOP taking these medications    PARAGARD INTRAUTERINE COPPER IU       TAKE these medications    Prenatal 28-0.8 MG Tabs Take 1 tablet by mouth daily.   sertraline  100 MG tablet Commonly known as: ZOLOFT  Take 1 tablet (100 mg total) by mouth at bedtime. What changed: Another medication with the same name was removed. Continue taking this medication, and follow the directions you see here.         Jomarie Charlie LABOR, MD 10/01/2024 7:35 PM   "

## 2024-10-02 ENCOUNTER — Telehealth: Payer: Self-pay

## 2024-10-02 NOTE — Telephone Encounter (Signed)
 Called patient. Confirmed identity with last name and DOB. Patient saw MyChart message last night re: plan. Will send message to [facility] to schedule for lab draw 12/30 PM. All questions answered.   Charlie DELENA Courts, MD

## 2024-10-03 ENCOUNTER — Other Ambulatory Visit: Payer: Self-pay

## 2024-10-03 DIAGNOSIS — Z3202 Encounter for pregnancy test, result negative: Secondary | ICD-10-CM

## 2024-10-04 LAB — BETA HCG QUANT (REF LAB): hCG Quant: 2 m[IU]/mL

## 2024-10-19 ENCOUNTER — Encounter: Payer: Self-pay | Admitting: Women's Health

## 2024-10-19 ENCOUNTER — Ambulatory Visit (INDEPENDENT_AMBULATORY_CARE_PROVIDER_SITE_OTHER): Payer: Self-pay | Admitting: Women's Health

## 2024-10-19 ENCOUNTER — Other Ambulatory Visit (HOSPITAL_COMMUNITY)
Admission: RE | Admit: 2024-10-19 | Discharge: 2024-10-19 | Disposition: A | Payer: Self-pay | Source: Ambulatory Visit | Attending: Women's Health | Admitting: Women's Health

## 2024-10-19 VITALS — BP 117/85 | HR 88 | Ht 68.0 in | Wt 237.0 lb

## 2024-10-19 DIAGNOSIS — Z124 Encounter for screening for malignant neoplasm of cervix: Secondary | ICD-10-CM | POA: Insufficient documentation

## 2024-10-19 DIAGNOSIS — L509 Urticaria, unspecified: Secondary | ICD-10-CM

## 2024-10-19 DIAGNOSIS — O039 Complete or unspecified spontaneous abortion without complication: Secondary | ICD-10-CM

## 2024-10-19 NOTE — Progress Notes (Signed)
 "  GYN VISIT Patient name: ELLICE Kim MRN [id number]  Date of birth: 09-Feb-2001 Chief Complaint:   Follow-up [facility] 12-29 25  HCG falling,stop bleeding on 10-10-24)  History of Present Illness:   Brittany Kim is a 24 y.o. [phone] Caucasian female being seen today for f/u on recent SAB.  +HPT on 12/24, went to MAU 12/28 w/ spotting/cramping, HCG 7, down to 2 two days later.  Wants another pregnancy. Itchy hands/swelling.  Patient's last menstrual period was 08/26/2024. The current method of family planning is none.  Last pap 02/02/22. Results were: NILM w/ HRHPV not done, wants to go ahead and do now     06/19/2024   11:43 AM 01/20/2024    2:39 PM 02/02/2022   10:47 AM 04/24/2019   11:27 AM 09/30/2017    2:52 PM  Depression screen PHQ 2/9  Decreased Interest  0 0 1 0  Down, Depressed, Hopeless  0 0 0 0  PHQ - 2 Score  0 0 1 0  Altered sleeping  0 0 0 0  Tired, decreased energy  1 0 1 1  Change in appetite  2 0 3 1  Feeling bad or failure about yourself   0 0 0 0  Trouble concentrating  0 0 0 0  Moving slowly or fidgety/restless  0 0 0 0  Suicidal thoughts  0 0 0 0  PHQ-9 Score  3  0  5  2   Difficult doing work/chores  Not difficult at all Not difficult at all  Not difficult at all     Information is confidential and restricted. Go to Review Flowsheets to unlock data.   Data saved with a previous flowsheet row definition        06/19/2024   11:43 AM 01/20/2024    2:39 PM  GAD 7 : Generalized Anxiety Score  Nervous, Anxious, on Edge  2  Control/stop worrying  1  Worry too much - different things  1  Trouble relaxing  1  Restless  0  Easily annoyed or irritable  2  Afraid - awful might happen  0  Total GAD 7 Score  7  Anxiety Difficulty  Not difficult at all     Information is confidential and restricted. Go to Review Flowsheets to unlock data.     Review of Systems:   Pertinent items are noted in HPI Denies fever/chills, dizziness, headaches, visual  disturbances, fatigue, shortness of breath, chest pain, abdominal pain, vomiting, abnormal vaginal discharge/itching/odor/irritation, problems with periods, bowel movements, urination, or intercourse unless otherwise stated above.  Pertinent History Reviewed:  Reviewed past medical,surgical, social, obstetrical and family history.  Reviewed problem list, medications and allergies. Physical Assessment:   Vitals:   10/19/24 1559  BP: 117/85  Pulse: 88  Weight: 237 lb (107.5 kg)  Height: 5' 8 (1.727 m)  Body mass index is 36.04 kg/m.       Physical Examination:   General appearance: alert, well appearing, and in no distress  Mental status: alert, oriented to person, place, and time  Skin: warm & dry   Cardiovascular: normal heart rate noted  Respiratory: normal respiratory effort, no distress  Abdomen: soft, non-tender   Pelvic: VULVA: normal appearing vulva with no masses, tenderness or lesions, VAGINA: normal appearing vagina with normal color and discharge, no lesions, CERVIX: normal appearing cervix without discharge or lesions. Lt inner thigh w/ what appears to be a hive  Extremities: no edema   Chaperone:  Peggy Dones  No results found for this or any previous visit (from the past 24 hours).  Assessment & Plan:  1) Recent SAB> quant down to 2 (on 12/30), wants another pregnancy. Regular periods. Continue pnv. Let us  know when gets +HPT or if no period in and not pregnant  2) Hives> try otc antihistamine  3) Cervical cancer screen> pap today  Meds: No orders of the defined types were placed in this encounter.   No orders of the defined types were placed in this encounter.   Return in about 1 year (around 10/19/2025) for Physical.  Suzen JONELLE Fetters CNM, [facility] 10/19/2024 4:25 PM  "

## 2024-10-19 NOTE — Addendum Note (Signed)
 Addended by: SANNA GONG A on: 10/19/2024 04:39 PM   Modules accepted: Orders

## 2024-10-24 ENCOUNTER — Encounter: Payer: Self-pay | Admitting: Women's Health

## 2024-10-24 ENCOUNTER — Ambulatory Visit: Payer: Self-pay | Admitting: Women's Health

## 2024-10-24 DIAGNOSIS — R87619 Unspecified abnormal cytological findings in specimens from cervix uteri: Secondary | ICD-10-CM | POA: Insufficient documentation

## 2024-10-24 LAB — CYTOLOGY - PAP
Comment: NEGATIVE
Comment: NEGATIVE
Comment: NEGATIVE
HPV 16: NEGATIVE
HPV 18 / 45: NEGATIVE
High risk HPV: POSITIVE — AB
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
## 2024-11-01 ENCOUNTER — Other Ambulatory Visit: Payer: Self-pay | Admitting: Women's Health

## 2024-11-01 ENCOUNTER — Encounter: Payer: Self-pay | Admitting: Psychiatry

## 2024-11-01 ENCOUNTER — Telehealth: Payer: Self-pay | Admitting: Psychiatry

## 2024-11-01 DIAGNOSIS — Z30432 Encounter for removal of intrauterine contraceptive device: Secondary | ICD-10-CM

## 2024-11-01 DIAGNOSIS — F419 Anxiety disorder, unspecified: Secondary | ICD-10-CM

## 2024-11-01 DIAGNOSIS — F4310 Post-traumatic stress disorder, unspecified: Secondary | ICD-10-CM

## 2024-11-01 DIAGNOSIS — F1090 Alcohol use, unspecified, uncomplicated: Secondary | ICD-10-CM

## 2024-11-01 MED ORDER — PRENATAL 28-0.8 MG PO TABS: 1.0000 | ORAL_TABLET | Freq: Every day | ORAL | 12 refills | Status: AC

## 2024-11-01 MED ORDER — SERTRALINE HCL 100 MG PO TABS: 100.0000 mg | ORAL_TABLET | Freq: Every day | ORAL | 0 refills | Status: AC

## 2024-11-01 MED ORDER — NALTREXONE HCL 50 MG PO TABS: 25.0000 mg | ORAL_TABLET | Freq: Every day | ORAL | 1 refills | Status: AC

## 2024-11-01 NOTE — Patient Instructions (Signed)
 Continue sertraline  100 mg at night  Start naltrexone  25 mg at night Obtain labs- vitamin D (if able to afford this) Next appointment- 3/4 at 9 AM,

## 2024-12-05 ENCOUNTER — Encounter: Payer: Self-pay | Admitting: Women's Health

## 2024-12-06 ENCOUNTER — Telehealth: Payer: Self-pay | Admitting: Psychiatry

## 2025-01-18 ENCOUNTER — Ambulatory Visit

## 2025-01-25 ENCOUNTER — Encounter: Admitting: Family Medicine
